# Patient Record
Sex: Female | Born: 1935 | Race: White | Hispanic: No | Marital: Married
Health system: Southern US, Community
[De-identification: ages and names within clinical notes are randomized; demographics above are authoritative.]

---

## 2004-12-19 ENCOUNTER — Ambulatory Visit: Payer: Self-pay | Admitting: Internal Medicine

## 2005-07-23 ENCOUNTER — Ambulatory Visit: Payer: Self-pay | Admitting: Ophthalmology

## 2005-07-30 ENCOUNTER — Ambulatory Visit: Payer: Self-pay | Admitting: Ophthalmology

## 2005-12-20 ENCOUNTER — Ambulatory Visit: Payer: Self-pay | Admitting: Internal Medicine

## 2006-12-24 ENCOUNTER — Ambulatory Visit: Payer: Self-pay | Admitting: Internal Medicine

## 2007-12-31 ENCOUNTER — Ambulatory Visit: Payer: Self-pay | Admitting: Internal Medicine

## 2009-01-04 ENCOUNTER — Ambulatory Visit: Payer: Self-pay | Admitting: Internal Medicine

## 2010-01-16 ENCOUNTER — Ambulatory Visit: Payer: Self-pay | Admitting: Internal Medicine

## 2010-12-04 ENCOUNTER — Inpatient Hospital Stay: Payer: Self-pay | Admitting: Family Medicine

## 2011-01-18 ENCOUNTER — Ambulatory Visit: Payer: Self-pay | Admitting: Internal Medicine

## 2011-11-12 ENCOUNTER — Observation Stay: Payer: Self-pay | Admitting: Internal Medicine

## 2011-11-12 LAB — COMPREHENSIVE METABOLIC PANEL
Anion Gap: 9 (ref 7–16)
BUN: 20 mg/dL — ABNORMAL HIGH (ref 7–18)
Bilirubin,Total: 0.4 mg/dL (ref 0.2–1.0)
Chloride: 94 mmol/L — ABNORMAL LOW (ref 98–107)
Creatinine: 1.15 mg/dL (ref 0.60–1.30)
EGFR (African American): 51 — ABNORMAL LOW
Osmolality: 269 (ref 275–301)
SGOT(AST): 23 U/L (ref 15–37)
SGPT (ALT): 20 U/L
Total Protein: 7.7 g/dL (ref 6.4–8.2)

## 2011-11-12 LAB — URINALYSIS, COMPLETE
Bacteria: NONE SEEN
Glucose,UR: NEGATIVE mg/dL (ref 0–75)
Nitrite: NEGATIVE
RBC,UR: 2 /HPF (ref 0–5)
WBC UR: 11 /HPF (ref 0–5)

## 2011-11-12 LAB — TROPONIN I
Troponin-I: <0.02 ng/mL
Troponin-I: <0.02 ng/mL

## 2011-11-12 LAB — CBC
HCT: 36.6 % (ref 35.0–47.0)
HGB: 12.3 g/dL (ref 12.0–16.0)
MCH: 29.5 pg (ref 26.0–34.0)
MCHC: 33.6 g/dL (ref 32.0–36.0)
Platelet: 257 10*3/uL (ref 150–440)
RBC: 4.17 10*6/uL (ref 3.80–5.20)
WBC: 11.3 10*3/uL — ABNORMAL HIGH (ref 3.6–11.0)

## 2011-11-12 LAB — CK TOTAL AND CKMB (NOT AT ARMC): CK, Total: 54 U/L (ref 21–215)

## 2011-11-13 LAB — BASIC METABOLIC PANEL
Anion Gap: 10 (ref 7–16)
Calcium, Total: 9.3 mg/dL (ref 8.5–10.1)
EGFR (African American): 48 — ABNORMAL LOW
EGFR (Non-African Amer.): 41 — ABNORMAL LOW
Glucose: 166 mg/dL — ABNORMAL HIGH (ref 65–99)
Sodium: 132 mmol/L — ABNORMAL LOW (ref 136–145)

## 2011-11-13 LAB — CK TOTAL AND CKMB (NOT AT ARMC): CK, Total: 110 U/L (ref 21–215)

## 2011-11-14 LAB — BASIC METABOLIC PANEL
Chloride: 93 mmol/L — ABNORMAL LOW (ref 98–107)
Creatinine: 0.92 mg/dL (ref 0.60–1.30)
EGFR (Non-African Amer.): 58 — ABNORMAL LOW
Glucose: 61 mg/dL — ABNORMAL LOW (ref 65–99)
Potassium: 3.9 mmol/L (ref 3.5–5.1)
Sodium: 130 mmol/L — ABNORMAL LOW (ref 136–145)

## 2011-11-14 LAB — TSH: Thyroid Stimulating Horm: 3.14 u[IU]/mL

## 2011-11-27 ENCOUNTER — Emergency Department: Payer: Self-pay | Admitting: Emergency Medicine

## 2011-11-27 LAB — CBC WITH DIFFERENTIAL/PLATELET
Basophil #: 0.1 10*3/uL (ref 0.0–0.1)
Eosinophil %: 1.6 %
Lymphocyte #: 1.8 10*3/uL (ref 1.0–3.6)
MCH: 30.1 pg (ref 26.0–34.0)
MCHC: 34.7 g/dL (ref 32.0–36.0)
MCV: 87 fL (ref 80–100)
Monocyte #: 0.6 x10 3/mm (ref 0.2–0.9)
Neutrophil #: 5.9 10*3/uL (ref 1.4–6.5)
Neutrophil %: 70.2 %
Platelet: 240 10*3/uL (ref 150–440)
RBC: 3.93 10*6/uL (ref 3.80–5.20)
RDW: 13.2 % (ref 11.5–14.5)
WBC: 8.4 10*3/uL (ref 3.6–11.0)

## 2011-11-27 LAB — COMPREHENSIVE METABOLIC PANEL
Alkaline Phosphatase: 55 U/L (ref 50–136)
Anion Gap: 11 (ref 7–16)
Bilirubin,Total: 0.3 mg/dL (ref 0.2–1.0)
Co2: 28 mmol/L (ref 21–32)
Creatinine: 0.85 mg/dL (ref 0.60–1.30)
EGFR (Non-African Amer.): >60
Glucose: 156 mg/dL — ABNORMAL HIGH (ref 65–99)
Osmolality: 274 (ref 275–301)
Potassium: 4.4 mmol/L (ref 3.5–5.1)
SGPT (ALT): 21 U/L
Sodium: 135 mmol/L — ABNORMAL LOW (ref 136–145)
Total Protein: 7.1 g/dL (ref 6.4–8.2)

## 2011-11-27 LAB — URINALYSIS, COMPLETE
Ketone: NEGATIVE
Ph: 6 (ref 4.5–8.0)
Protein: 30
Specific Gravity: 1.009 (ref 1.003–1.030)
Squamous Epithelial: <1
WBC UR: <1 /HPF (ref 0–5)

## 2011-11-27 LAB — CK TOTAL AND CKMB (NOT AT ARMC): CK, Total: 60 U/L (ref 21–215)

## 2012-01-21 ENCOUNTER — Ambulatory Visit: Payer: Self-pay | Admitting: Internal Medicine

## 2013-01-21 ENCOUNTER — Ambulatory Visit: Payer: Self-pay | Admitting: Internal Medicine

## 2013-09-28 ENCOUNTER — Ambulatory Visit: Payer: Self-pay | Admitting: Hospice and Palliative Medicine

## 2013-09-28 ENCOUNTER — Ambulatory Visit
Admit: 2013-09-28 | Disposition: A | Discharge status: Home / Self Care | Payer: Self-pay | Attending: Nurse Practitioner | Admitting: Nurse Practitioner

## 2013-10-07 ENCOUNTER — Inpatient Hospital Stay: Payer: Self-pay | Admitting: Internal Medicine

## 2013-10-07 LAB — COMPREHENSIVE METABOLIC PANEL
ALBUMIN: 3.2 g/dL — AB (ref 3.4–5.0)
ALK PHOS: 59 U/L
AST: 18 U/L (ref 15–37)
Anion Gap: 11 (ref 7–16)
BUN: 24 mg/dL — ABNORMAL HIGH (ref 7–18)
Bilirubin,Total: 0.7 mg/dL (ref 0.2–1.0)
Calcium, Total: 9 mg/dL (ref 8.5–10.1)
Chloride: 91 mmol/L — ABNORMAL LOW (ref 98–107)
Co2: 26 mmol/L (ref 21–32)
Creatinine: 1.54 mg/dL — ABNORMAL HIGH (ref 0.60–1.30)
EGFR (African American): 35 — ABNORMAL LOW
GFR CALC NON AF AMER: 30 — AB
Glucose: 326 mg/dL — ABNORMAL HIGH (ref 65–99)
Osmolality: 274 (ref 275–301)
Potassium: 4.1 mmol/L (ref 3.5–5.1)
SGPT (ALT): 16 U/L (ref 12–78)
Sodium: 128 mmol/L — ABNORMAL LOW (ref 136–145)
TOTAL PROTEIN: 7.5 g/dL (ref 6.4–8.2)

## 2013-10-07 LAB — APTT: ACTIVATED PTT: 33.3 s (ref 23.6–35.9)

## 2013-10-07 LAB — CK TOTAL AND CKMB (NOT AT ARMC)
CK, TOTAL: 66 U/L
CK-MB: 1.1 ng/mL (ref 0.5–3.6)

## 2013-10-07 LAB — CBC
HCT: 33.7 % — AB (ref 35.0–47.0)
HGB: 11 g/dL — ABNORMAL LOW (ref 12.0–16.0)
MCH: 29.5 pg (ref 26.0–34.0)
MCHC: 32.6 g/dL (ref 32.0–36.0)
MCV: 91 fL (ref 80–100)
Platelet: 285 10*3/uL (ref 150–440)
RBC: 3.72 10*6/uL — ABNORMAL LOW (ref 3.80–5.20)
RDW: 13.1 % (ref 11.5–14.5)
WBC: 19.4 10*3/uL — ABNORMAL HIGH (ref 3.6–11.0)

## 2013-10-07 LAB — PROTIME-INR
INR: 1.1
Prothrombin Time: 13.9 secs (ref 11.5–14.7)

## 2013-10-07 LAB — PRO B NATRIURETIC PEPTIDE: B-Type Natriuretic Peptide: 3813 pg/mL — ABNORMAL HIGH (ref 0–450)

## 2013-10-07 LAB — D-DIMER(ARMC): D-DIMER: 1597 ng/mL

## 2013-10-07 LAB — TROPONIN I: Troponin-I: 0.04 ng/mL

## 2013-10-08 LAB — CBC WITH DIFFERENTIAL/PLATELET
BASOS ABS: 0 10*3/uL (ref 0.0–0.1)
BASOS PCT: 0.1 %
Eosinophil #: 0 10*3/uL (ref 0.0–0.7)
Eosinophil %: 0 %
HCT: 29.7 % — ABNORMAL LOW (ref 35.0–47.0)
HGB: 9.8 g/dL — ABNORMAL LOW (ref 12.0–16.0)
Lymphocyte #: 0.5 10*3/uL — ABNORMAL LOW (ref 1.0–3.6)
Lymphocyte %: 2.5 %
MCH: 29.5 pg (ref 26.0–34.0)
MCHC: 32.8 g/dL (ref 32.0–36.0)
MCV: 90 fL (ref 80–100)
MONO ABS: 0.8 x10 3/mm (ref 0.2–0.9)
Monocyte %: 3.9 %
NEUTROS ABS: 18.9 10*3/uL — AB (ref 1.4–6.5)
Neutrophil %: 93.5 %
PLATELETS: 231 10*3/uL (ref 150–440)
RBC: 3.31 10*6/uL — ABNORMAL LOW (ref 3.80–5.20)
RDW: 13.3 % (ref 11.5–14.5)
WBC: 20.3 10*3/uL — ABNORMAL HIGH (ref 3.6–11.0)

## 2013-10-08 LAB — BASIC METABOLIC PANEL
Anion Gap: 9 (ref 7–16)
BUN: 40 mg/dL — ABNORMAL HIGH (ref 7–18)
CALCIUM: 9 mg/dL (ref 8.5–10.1)
Chloride: 95 mmol/L — ABNORMAL LOW (ref 98–107)
Co2: 25 mmol/L (ref 21–32)
Creatinine: 1.5 mg/dL — ABNORMAL HIGH (ref 0.60–1.30)
EGFR (African American): 36 — ABNORMAL LOW
GFR CALC NON AF AMER: 31 — AB
GLUCOSE: 202 mg/dL — AB (ref 65–99)
Osmolality: 274 (ref 275–301)
Potassium: 4.4 mmol/L (ref 3.5–5.1)
SODIUM: 129 mmol/L — AB (ref 136–145)

## 2013-10-09 LAB — CBC WITH DIFFERENTIAL/PLATELET
BASOS PCT: 0.1 %
Basophil #: 0 10*3/uL (ref 0.0–0.1)
EOS ABS: 0 10*3/uL (ref 0.0–0.7)
EOS PCT: 0.1 %
HCT: 28.8 % — AB (ref 35.0–47.0)
HGB: 9.7 g/dL — AB (ref 12.0–16.0)
LYMPHS ABS: 0.6 10*3/uL — AB (ref 1.0–3.6)
LYMPHS PCT: 3.4 %
MCH: 30.2 pg (ref 26.0–34.0)
MCHC: 33.7 g/dL (ref 32.0–36.0)
MCV: 90 fL (ref 80–100)
Monocyte #: 1.2 x10 3/mm — ABNORMAL HIGH (ref 0.2–0.9)
Monocyte %: 6.3 %
Neutrophil #: 17.3 10*3/uL — ABNORMAL HIGH (ref 1.4–6.5)
Neutrophil %: 90.1 %
PLATELETS: 249 10*3/uL (ref 150–440)
RBC: 3.21 10*6/uL — ABNORMAL LOW (ref 3.80–5.20)
RDW: 13.4 % (ref 11.5–14.5)
WBC: 19.2 10*3/uL — AB (ref 3.6–11.0)

## 2013-10-09 LAB — CK-MB
CK-MB: 11.5 ng/mL — ABNORMAL HIGH (ref 0.5–3.6)
CK-MB: 10.6 ng/mL — ABNORMAL HIGH (ref 0.5–3.6)
CK-MB: 18.4 ng/mL — AB (ref 0.5–3.6)

## 2013-10-09 LAB — BASIC METABOLIC PANEL
Anion Gap: 6 — ABNORMAL LOW (ref 7–16)
BUN: 41 mg/dL — ABNORMAL HIGH (ref 7–18)
CALCIUM: 8.6 mg/dL (ref 8.5–10.1)
CHLORIDE: 101 mmol/L (ref 98–107)
Co2: 24 mmol/L (ref 21–32)
Creatinine: 1.25 mg/dL (ref 0.60–1.30)
EGFR (African American): 45 — ABNORMAL LOW
GFR CALC NON AF AMER: 39 — AB
Glucose: 89 mg/dL (ref 65–99)
Osmolality: 272 (ref 275–301)
POTASSIUM: 4.2 mmol/L (ref 3.5–5.1)
Sodium: 131 mmol/L — ABNORMAL LOW (ref 136–145)

## 2013-10-09 LAB — TROPONIN I
TROPONIN-I: 0.63 ng/mL — AB
TROPONIN-I: 0.9 ng/mL — AB

## 2013-10-10 LAB — CBC WITH DIFFERENTIAL/PLATELET
Basophil #: 0 10*3/uL (ref 0.0–0.1)
Basophil %: 0.1 %
EOS PCT: 0 %
Eosinophil #: 0 10*3/uL (ref 0.0–0.7)
HCT: 28.2 % — ABNORMAL LOW (ref 35.0–47.0)
HGB: 9.5 g/dL — ABNORMAL LOW (ref 12.0–16.0)
Lymphocyte #: 0.5 10*3/uL — ABNORMAL LOW (ref 1.0–3.6)
Lymphocyte %: 4.2 %
MCH: 29.5 pg (ref 26.0–34.0)
MCHC: 33.7 g/dL (ref 32.0–36.0)
MCV: 88 fL (ref 80–100)
Monocyte #: 0.7 x10 3/mm (ref 0.2–0.9)
Monocyte %: 5.7 %
NEUTROS ABS: 10.4 10*3/uL — AB (ref 1.4–6.5)
Neutrophil %: 90 %
Platelet: 230 10*3/uL (ref 150–440)
RBC: 3.21 10*6/uL — AB (ref 3.80–5.20)
RDW: 13.2 % (ref 11.5–14.5)
WBC: 11.6 10*3/uL — ABNORMAL HIGH (ref 3.6–11.0)

## 2013-10-10 LAB — BASIC METABOLIC PANEL
Anion Gap: 8 (ref 7–16)
BUN: 43 mg/dL — ABNORMAL HIGH (ref 7–18)
CALCIUM: 9.5 mg/dL (ref 8.5–10.1)
CREATININE: 1.28 mg/dL (ref 0.60–1.30)
Chloride: 99 mmol/L (ref 98–107)
Co2: 26 mmol/L (ref 21–32)
EGFR (African American): 44 — ABNORMAL LOW
GFR CALC NON AF AMER: 38 — AB
GLUCOSE: 102 mg/dL — AB (ref 65–99)
Osmolality: 277 (ref 275–301)
Potassium: 3 mmol/L — ABNORMAL LOW (ref 3.5–5.1)
SODIUM: 133 mmol/L — AB (ref 136–145)

## 2013-10-11 LAB — CBC WITH DIFFERENTIAL/PLATELET
Basophil #: 0 10*3/uL (ref 0.0–0.1)
Basophil %: 0.1 %
Eosinophil #: 0.1 10*3/uL (ref 0.0–0.7)
Eosinophil %: 0.3 %
HCT: 28.1 % — AB (ref 35.0–47.0)
HGB: 9.2 g/dL — ABNORMAL LOW (ref 12.0–16.0)
Lymphocyte #: 0.9 10*3/uL — ABNORMAL LOW (ref 1.0–3.6)
Lymphocyte %: 5.2 %
MCH: 28.9 pg (ref 26.0–34.0)
MCHC: 32.8 g/dL (ref 32.0–36.0)
MCV: 88 fL (ref 80–100)
Monocyte #: 1.1 x10 3/mm — ABNORMAL HIGH (ref 0.2–0.9)
Monocyte %: 6.5 %
Neutrophil #: 14.5 10*3/uL — ABNORMAL HIGH (ref 1.4–6.5)
Neutrophil %: 87.9 %
PLATELETS: 315 10*3/uL (ref 150–440)
RBC: 3.2 10*6/uL — ABNORMAL LOW (ref 3.80–5.20)
RDW: 13.6 % (ref 11.5–14.5)
WBC: 16.5 10*3/uL — AB (ref 3.6–11.0)

## 2013-10-11 LAB — BASIC METABOLIC PANEL
ANION GAP: 9 (ref 7–16)
BUN: 48 mg/dL — ABNORMAL HIGH (ref 7–18)
CALCIUM: 9 mg/dL (ref 8.5–10.1)
CO2: 26 mmol/L (ref 21–32)
Chloride: 98 mmol/L (ref 98–107)
Creatinine: 1.6 mg/dL — ABNORMAL HIGH (ref 0.60–1.30)
EGFR (African American): 34 — ABNORMAL LOW
GFR CALC NON AF AMER: 29 — AB
Glucose: 132 mg/dL — ABNORMAL HIGH (ref 65–99)
Osmolality: 281 (ref 275–301)
Potassium: 3.6 mmol/L (ref 3.5–5.1)
SODIUM: 133 mmol/L — AB (ref 136–145)

## 2013-10-12 LAB — CULTURE, BLOOD (SINGLE)

## 2013-10-12 LAB — BASIC METABOLIC PANEL
ANION GAP: 10 (ref 7–16)
BUN: 57 mg/dL — AB (ref 7–18)
CHLORIDE: 97 mmol/L — AB (ref 98–107)
CREATININE: 1.64 mg/dL — AB (ref 0.60–1.30)
Calcium, Total: 9.2 mg/dL (ref 8.5–10.1)
Co2: 27 mmol/L (ref 21–32)
EGFR (Non-African Amer.): 28 — ABNORMAL LOW
GFR CALC AF AMER: 33 — AB
GLUCOSE: 244 mg/dL — AB (ref 65–99)
Osmolality: 292 (ref 275–301)
Potassium: 3.5 mmol/L (ref 3.5–5.1)
SODIUM: 134 mmol/L — AB (ref 136–145)

## 2013-10-13 LAB — CBC WITH DIFFERENTIAL/PLATELET
Basophil #: 0 10*3/uL (ref 0.0–0.1)
Basophil %: 0.1 %
Eosinophil #: 0 10*3/uL (ref 0.0–0.7)
Eosinophil %: 0.1 %
HCT: 31 % — AB (ref 35.0–47.0)
HGB: 10.2 g/dL — ABNORMAL LOW (ref 12.0–16.0)
LYMPHS ABS: 0.5 10*3/uL — AB (ref 1.0–3.6)
LYMPHS PCT: 3 %
MCH: 29.6 pg (ref 26.0–34.0)
MCHC: 33.1 g/dL (ref 32.0–36.0)
MCV: 90 fL (ref 80–100)
Monocyte #: 0.9 x10 3/mm (ref 0.2–0.9)
Monocyte %: 5.3 %
NEUTROS ABS: 15.9 10*3/uL — AB (ref 1.4–6.5)
Neutrophil %: 91.5 %
Platelet: 331 10*3/uL (ref 150–440)
RBC: 3.45 10*6/uL — AB (ref 3.80–5.20)
RDW: 14 % (ref 11.5–14.5)
WBC: 17.4 10*3/uL — AB (ref 3.6–11.0)

## 2013-10-13 LAB — BASIC METABOLIC PANEL
Anion Gap: 8 (ref 7–16)
BUN: 67 mg/dL — AB (ref 7–18)
CALCIUM: 9.7 mg/dL (ref 8.5–10.1)
Chloride: 96 mmol/L — ABNORMAL LOW (ref 98–107)
Co2: 32 mmol/L (ref 21–32)
Creatinine: 1.37 mg/dL — ABNORMAL HIGH (ref 0.60–1.30)
EGFR (African American): 41 — ABNORMAL LOW
EGFR (Non-African Amer.): 35 — ABNORMAL LOW
GLUCOSE: 278 mg/dL — AB (ref 65–99)
OSMOLALITY: 301 (ref 275–301)
POTASSIUM: 3.5 mmol/L (ref 3.5–5.1)
Sodium: 136 mmol/L (ref 136–145)

## 2013-10-13 LAB — EXPECTORATED SPUTUM ASSESSMENT W REFEX TO RESP CULTURE

## 2013-10-14 LAB — CBC WITH DIFFERENTIAL/PLATELET
Basophil #: 0 10*3/uL (ref 0.0–0.1)
Basophil %: 0.3 %
EOS ABS: 0 10*3/uL (ref 0.0–0.7)
Eosinophil %: 0 %
HCT: 30.8 % — ABNORMAL LOW (ref 35.0–47.0)
HGB: 10 g/dL — AB (ref 12.0–16.0)
LYMPHS ABS: 0.6 10*3/uL — AB (ref 1.0–3.6)
Lymphocyte %: 3.6 %
MCH: 29.4 pg (ref 26.0–34.0)
MCHC: 32.6 g/dL (ref 32.0–36.0)
MCV: 90 fL (ref 80–100)
Monocyte #: 1.3 x10 3/mm — ABNORMAL HIGH (ref 0.2–0.9)
Monocyte %: 7.4 %
Neutrophil #: 15.6 10*3/uL — ABNORMAL HIGH (ref 1.4–6.5)
Neutrophil %: 88.7 %
PLATELETS: 324 10*3/uL (ref 150–440)
RBC: 3.41 10*6/uL — ABNORMAL LOW (ref 3.80–5.20)
RDW: 13.9 % (ref 11.5–14.5)
WBC: 17.6 10*3/uL — ABNORMAL HIGH (ref 3.6–11.0)

## 2013-10-14 LAB — BASIC METABOLIC PANEL
Anion Gap: 6 — ABNORMAL LOW (ref 7–16)
BUN: 68 mg/dL — ABNORMAL HIGH (ref 7–18)
CALCIUM: 9.4 mg/dL (ref 8.5–10.1)
CO2: 33 mmol/L — AB (ref 21–32)
CREATININE: 1.27 mg/dL (ref 0.60–1.30)
Chloride: 98 mmol/L (ref 98–107)
EGFR (Non-African Amer.): 38 — ABNORMAL LOW
GFR CALC AF AMER: 45 — AB
GLUCOSE: 231 mg/dL — AB (ref 65–99)
OSMOLALITY: 301 (ref 275–301)
Potassium: 4.1 mmol/L (ref 3.5–5.1)
Sodium: 137 mmol/L (ref 136–145)

## 2013-10-14 LAB — EXPECTORATED SPUTUM ASSESSMENT W GRAM STAIN, RFLX TO RESP C

## 2013-10-14 LAB — PHOSPHORUS: Phosphorus: 3.3 mg/dL (ref 2.5–4.9)

## 2013-10-14 LAB — MAGNESIUM: Magnesium: 2 mg/dL

## 2013-10-15 LAB — CBC WITH DIFFERENTIAL/PLATELET
BASOS ABS: 0 10*3/uL (ref 0.0–0.1)
BASOS PCT: 0.2 %
EOS PCT: 0 %
Eosinophil #: 0 10*3/uL (ref 0.0–0.7)
HCT: 33.2 % — ABNORMAL LOW (ref 35.0–47.0)
HGB: 10.9 g/dL — AB (ref 12.0–16.0)
Lymphocyte #: 0.9 10*3/uL — ABNORMAL LOW (ref 1.0–3.6)
Lymphocyte %: 3.7 %
MCH: 29.8 pg (ref 26.0–34.0)
MCHC: 32.8 g/dL (ref 32.0–36.0)
MCV: 91 fL (ref 80–100)
Monocyte #: 1.6 x10 3/mm — ABNORMAL HIGH (ref 0.2–0.9)
Monocyte %: 6.8 %
NEUTROS ABS: 20.7 10*3/uL — AB (ref 1.4–6.5)
NEUTROS PCT: 89.3 %
PLATELETS: 374 10*3/uL (ref 150–440)
RBC: 3.65 10*6/uL — AB (ref 3.80–5.20)
RDW: 14 % (ref 11.5–14.5)
WBC: 23.2 10*3/uL — ABNORMAL HIGH (ref 3.6–11.0)

## 2013-10-15 LAB — BASIC METABOLIC PANEL
Anion Gap: 3 — ABNORMAL LOW (ref 7–16)
BUN: 58 mg/dL — ABNORMAL HIGH (ref 7–18)
CALCIUM: 10.1 mg/dL (ref 8.5–10.1)
CREATININE: 1.06 mg/dL (ref 0.60–1.30)
Chloride: 101 mmol/L (ref 98–107)
Co2: 34 mmol/L — ABNORMAL HIGH (ref 21–32)
EGFR (African American): 55 — ABNORMAL LOW
EGFR (Non-African Amer.): 48 — ABNORMAL LOW
Glucose: 197 mg/dL — ABNORMAL HIGH (ref 65–99)
Osmolality: 297 (ref 275–301)
POTASSIUM: 5.3 mmol/L — AB (ref 3.5–5.1)
Sodium: 138 mmol/L (ref 136–145)

## 2013-10-15 LAB — URINE CULTURE

## 2013-10-16 LAB — CBC WITH DIFFERENTIAL/PLATELET
BASOS ABS: 0 10*3/uL (ref 0.0–0.1)
BASOS PCT: 0.2 %
Eosinophil #: 0 10*3/uL (ref 0.0–0.7)
Eosinophil %: 0 %
HCT: 33.1 % — ABNORMAL LOW (ref 35.0–47.0)
HGB: 10.7 g/dL — ABNORMAL LOW (ref 12.0–16.0)
LYMPHS ABS: 0.9 10*3/uL — AB (ref 1.0–3.6)
LYMPHS PCT: 4.8 %
MCH: 29.5 pg (ref 26.0–34.0)
MCHC: 32.2 g/dL (ref 32.0–36.0)
MCV: 92 fL (ref 80–100)
MONO ABS: 1 x10 3/mm — AB (ref 0.2–0.9)
MONOS PCT: 5.1 %
NEUTROS ABS: 17.3 10*3/uL — AB (ref 1.4–6.5)
Neutrophil %: 89.9 %
Platelet: 307 10*3/uL (ref 150–440)
RBC: 3.61 10*6/uL — ABNORMAL LOW (ref 3.80–5.20)
RDW: 14 % (ref 11.5–14.5)
WBC: 19.2 10*3/uL — ABNORMAL HIGH (ref 3.6–11.0)

## 2013-10-16 LAB — BASIC METABOLIC PANEL
ANION GAP: 4 — AB (ref 7–16)
BUN: 50 mg/dL — ABNORMAL HIGH (ref 7–18)
CHLORIDE: 102 mmol/L (ref 98–107)
CO2: 31 mmol/L (ref 21–32)
Calcium, Total: 9.6 mg/dL (ref 8.5–10.1)
Creatinine: 0.91 mg/dL (ref 0.60–1.30)
EGFR (African American): >60
EGFR (Non-African Amer.): 58 — ABNORMAL LOW
Glucose: 141 mg/dL — ABNORMAL HIGH (ref 65–99)
Osmolality: 290 (ref 275–301)
POTASSIUM: 4.2 mmol/L (ref 3.5–5.1)
Sodium: 137 mmol/L (ref 136–145)

## 2013-10-18 LAB — BASIC METABOLIC PANEL
Anion Gap: 7 (ref 7–16)
BUN: 45 mg/dL — ABNORMAL HIGH (ref 7–18)
Calcium, Total: 9.3 mg/dL (ref 8.5–10.1)
Chloride: 99 mmol/L (ref 98–107)
Co2: 29 mmol/L (ref 21–32)
Creatinine: 0.96 mg/dL (ref 0.60–1.30)
EGFR (Non-African Amer.): 54 — ABNORMAL LOW
Glucose: 232 mg/dL — ABNORMAL HIGH (ref 65–99)
Osmolality: 289 (ref 275–301)
Potassium: 4.5 mmol/L (ref 3.5–5.1)
Sodium: 135 mmol/L — ABNORMAL LOW (ref 136–145)

## 2013-10-18 LAB — CBC WITH DIFFERENTIAL/PLATELET
BASOS PCT: 0.1 %
Basophil #: 0 10*3/uL (ref 0.0–0.1)
EOS ABS: 0 10*3/uL (ref 0.0–0.7)
Eosinophil %: 0 %
HCT: 36.1 % (ref 35.0–47.0)
HGB: 11.5 g/dL — ABNORMAL LOW (ref 12.0–16.0)
LYMPHS ABS: 0.5 10*3/uL — AB (ref 1.0–3.6)
LYMPHS PCT: 2 %
MCH: 28.9 pg (ref 26.0–34.0)
MCHC: 31.8 g/dL — ABNORMAL LOW (ref 32.0–36.0)
MCV: 91 fL (ref 80–100)
MONOS PCT: 5.1 %
Monocyte #: 1.2 x10 3/mm — ABNORMAL HIGH (ref 0.2–0.9)
NEUTROS ABS: 21.8 10*3/uL — AB (ref 1.4–6.5)
Neutrophil %: 92.8 %
PLATELETS: 295 10*3/uL (ref 150–440)
RBC: 3.96 10*6/uL (ref 3.80–5.20)
RDW: 14.1 % (ref 11.5–14.5)
WBC: 23.5 10*3/uL — ABNORMAL HIGH (ref 3.6–11.0)

## 2013-10-18 LAB — CULTURE, BLOOD (SINGLE)

## 2013-10-19 LAB — CBC WITH DIFFERENTIAL/PLATELET
BASOS PCT: 0.1 %
Basophil #: 0 10*3/uL (ref 0.0–0.1)
EOS ABS: 0 10*3/uL (ref 0.0–0.7)
EOS PCT: 0 %
HCT: 34.2 % — AB (ref 35.0–47.0)
HGB: 10.8 g/dL — ABNORMAL LOW (ref 12.0–16.0)
LYMPHS ABS: 0.4 10*3/uL — AB (ref 1.0–3.6)
LYMPHS PCT: 2.5 %
MCH: 28.8 pg (ref 26.0–34.0)
MCHC: 31.6 g/dL — ABNORMAL LOW (ref 32.0–36.0)
MCV: 91 fL (ref 80–100)
MONO ABS: 0.8 x10 3/mm (ref 0.2–0.9)
MONOS PCT: 4.7 %
NEUTROS ABS: 16.5 10*3/uL — AB (ref 1.4–6.5)
Neutrophil %: 92.7 %
Platelet: 254 10*3/uL (ref 150–440)
RBC: 3.75 10*6/uL — AB (ref 3.80–5.20)
RDW: 14.1 % (ref 11.5–14.5)
WBC: 17.8 10*3/uL — AB (ref 3.6–11.0)

## 2013-10-19 LAB — BASIC METABOLIC PANEL
ANION GAP: 5 — AB (ref 7–16)
BUN: 47 mg/dL — ABNORMAL HIGH (ref 7–18)
CALCIUM: 8.6 mg/dL (ref 8.5–10.1)
CHLORIDE: 100 mmol/L (ref 98–107)
CO2: 32 mmol/L (ref 21–32)
Creatinine: 0.88 mg/dL (ref 0.60–1.30)
EGFR (African American): >60
GFR CALC NON AF AMER: 60 — AB
Glucose: 115 mg/dL — ABNORMAL HIGH (ref 65–99)
Osmolality: 287 (ref 275–301)
Potassium: 4.6 mmol/L (ref 3.5–5.1)
Sodium: 137 mmol/L (ref 136–145)

## 2013-10-20 LAB — CBC WITH DIFFERENTIAL/PLATELET
BASOS ABS: 0 10*3/uL (ref 0.0–0.1)
Basophil %: 0.1 %
EOS PCT: 0.1 %
Eosinophil #: 0 10*3/uL (ref 0.0–0.7)
HCT: 35.7 % (ref 35.0–47.0)
HGB: 11.2 g/dL — AB (ref 12.0–16.0)
LYMPHS PCT: 4.3 %
Lymphocyte #: 0.8 10*3/uL — ABNORMAL LOW (ref 1.0–3.6)
MCH: 28.4 pg (ref 26.0–34.0)
MCHC: 31.3 g/dL — ABNORMAL LOW (ref 32.0–36.0)
MCV: 91 fL (ref 80–100)
MONO ABS: 1 x10 3/mm — AB (ref 0.2–0.9)
MONOS PCT: 5.8 %
NEUTROS PCT: 89.7 %
Neutrophil #: 16.2 10*3/uL — ABNORMAL HIGH (ref 1.4–6.5)
Platelet: 252 10*3/uL (ref 150–440)
RBC: 3.93 10*6/uL (ref 3.80–5.20)
RDW: 14.2 % (ref 11.5–14.5)
WBC: 18 10*3/uL — ABNORMAL HIGH (ref 3.6–11.0)

## 2013-10-20 LAB — BASIC METABOLIC PANEL
Anion Gap: 5 — ABNORMAL LOW (ref 7–16)
BUN: 42 mg/dL — ABNORMAL HIGH (ref 7–18)
Calcium, Total: 9 mg/dL (ref 8.5–10.1)
Chloride: 98 mmol/L (ref 98–107)
Co2: 32 mmol/L (ref 21–32)
Creatinine: 0.7 mg/dL (ref 0.60–1.30)
EGFR (African American): >60
EGFR (Non-African Amer.): >60
GLUCOSE: 84 mg/dL (ref 65–99)
Osmolality: 280 (ref 275–301)
Potassium: 4.2 mmol/L (ref 3.5–5.1)
Sodium: 135 mmol/L — ABNORMAL LOW (ref 136–145)

## 2013-10-21 ENCOUNTER — Encounter: Payer: Self-pay | Admitting: Internal Medicine

## 2013-10-21 LAB — BASIC METABOLIC PANEL
ANION GAP: 6 — AB (ref 7–16)
BUN: 40 mg/dL — ABNORMAL HIGH (ref 7–18)
CHLORIDE: 96 mmol/L — AB (ref 98–107)
Calcium, Total: 9.3 mg/dL (ref 8.5–10.1)
Co2: 32 mmol/L (ref 21–32)
Creatinine: 0.68 mg/dL (ref 0.60–1.30)
EGFR (African American): >60
Glucose: 50 mg/dL — ABNORMAL LOW (ref 65–99)
Osmolality: 275 (ref 275–301)
Potassium: 4.2 mmol/L (ref 3.5–5.1)
Sodium: 134 mmol/L — ABNORMAL LOW (ref 136–145)

## 2013-10-23 LAB — FERRITIN: FERRITIN (ARMC): 301 ng/mL (ref 8–388)

## 2013-10-23 LAB — CBC WITH DIFFERENTIAL/PLATELET
Basophil #: 0 10*3/uL (ref 0.0–0.1)
Basophil %: 0.1 %
EOS ABS: 0.1 10*3/uL (ref 0.0–0.7)
Eosinophil %: 0.8 %
HCT: 35.9 % (ref 35.0–47.0)
HGB: 11.4 g/dL — AB (ref 12.0–16.0)
Lymphocyte #: 1 10*3/uL (ref 1.0–3.6)
Lymphocyte %: 8.3 %
MCH: 28.8 pg (ref 26.0–34.0)
MCHC: 31.8 g/dL — ABNORMAL LOW (ref 32.0–36.0)
MCV: 91 fL (ref 80–100)
MONOS PCT: 6.4 %
Monocyte #: 0.7 x10 3/mm (ref 0.2–0.9)
NEUTROS PCT: 84.4 %
Neutrophil #: 9.9 10*3/uL — ABNORMAL HIGH (ref 1.4–6.5)
Platelet: 249 10*3/uL (ref 150–440)
RBC: 3.96 10*6/uL (ref 3.80–5.20)
RDW: 14 % (ref 11.5–14.5)
WBC: 11.7 10*3/uL — AB (ref 3.6–11.0)

## 2013-10-23 LAB — IRON AND TIBC
Iron Bind.Cap.(Total): 239 ug/dL — ABNORMAL LOW (ref 250–450)
Iron Saturation: 21 %
Iron: 50 ug/dL (ref 50–170)
Unbound Iron-Bind.Cap.: 189 ug/dL

## 2013-10-23 LAB — BASIC METABOLIC PANEL
Anion Gap: 9 (ref 7–16)
BUN: 44 mg/dL — AB (ref 7–18)
CREATININE: 1.28 mg/dL (ref 0.60–1.30)
Calcium, Total: 9.1 mg/dL (ref 8.5–10.1)
Chloride: 92 mmol/L — ABNORMAL LOW (ref 98–107)
Co2: 29 mmol/L (ref 21–32)
EGFR (African American): 44 — ABNORMAL LOW
EGFR (Non-African Amer.): 38 — ABNORMAL LOW
Glucose: 201 mg/dL — ABNORMAL HIGH (ref 65–99)
Osmolality: 278 (ref 275–301)
POTASSIUM: 4.2 mmol/L (ref 3.5–5.1)
Sodium: 130 mmol/L — ABNORMAL LOW (ref 136–145)

## 2013-10-27 LAB — HEMOGLOBIN A1C: HEMOGLOBIN A1C: 6.7 % — AB (ref 4.2–6.3)

## 2013-10-28 ENCOUNTER — Ambulatory Visit
Admit: 2013-10-28 | Disposition: A | Discharge status: Home / Self Care | Payer: Self-pay | Attending: Nurse Practitioner | Admitting: Nurse Practitioner

## 2013-10-28 ENCOUNTER — Ambulatory Visit: Payer: Self-pay | Admitting: Hospice and Palliative Medicine

## 2013-10-28 ENCOUNTER — Encounter: Payer: Self-pay | Admitting: Internal Medicine

## 2013-11-03 ENCOUNTER — Ambulatory Visit: Payer: Self-pay | Admitting: Gerontology

## 2013-11-03 LAB — CBC WITH DIFFERENTIAL/PLATELET
BASOS PCT: 1 %
Basophil #: 0.1 10*3/uL (ref 0.0–0.1)
EOS ABS: 0.7 10*3/uL (ref 0.0–0.7)
EOS PCT: 11.8 %
HCT: 35.1 % (ref 35.0–47.0)
HGB: 11.4 g/dL — ABNORMAL LOW (ref 12.0–16.0)
LYMPHS PCT: 18.9 %
Lymphocyte #: 1.1 10*3/uL (ref 1.0–3.6)
MCH: 29.1 pg (ref 26.0–34.0)
MCHC: 32.4 g/dL (ref 32.0–36.0)
MCV: 90 fL (ref 80–100)
Monocyte #: 0.9 x10 3/mm (ref 0.2–0.9)
Monocyte %: 15.5 %
NEUTROS ABS: 2.9 10*3/uL (ref 1.4–6.5)
Neutrophil %: 52.8 %
Platelet: 142 10*3/uL — ABNORMAL LOW (ref 150–440)
RBC: 3.91 10*6/uL (ref 3.80–5.20)
RDW: 14.5 % (ref 11.5–14.5)
WBC: 5.5 10*3/uL (ref 3.6–11.0)

## 2013-11-03 LAB — BASIC METABOLIC PANEL
Anion Gap: 11 (ref 7–16)
BUN: 24 mg/dL — ABNORMAL HIGH (ref 7–18)
Calcium, Total: 8.9 mg/dL (ref 8.5–10.1)
Chloride: 96 mmol/L — ABNORMAL LOW (ref 98–107)
Co2: 27 mmol/L (ref 21–32)
Creatinine: 1.22 mg/dL (ref 0.60–1.30)
EGFR (African American): 47 — ABNORMAL LOW
GFR CALC NON AF AMER: 40 — AB
GLUCOSE: 130 mg/dL — AB (ref 65–99)
Osmolality: 274 (ref 275–301)
Potassium: 4 mmol/L (ref 3.5–5.1)
Sodium: 134 mmol/L — ABNORMAL LOW (ref 136–145)

## 2013-11-28 ENCOUNTER — Encounter: Payer: Self-pay | Admitting: Internal Medicine

## 2013-12-19 IMAGING — CT CT HEAD WITHOUT CONTRAST
3 of 4 series · 17 of 30 positions shown, 19 images · non-contrast
Comparison: none

REASON FOR EXAM: CVA
COMMENTS:   May transport without cardiac monitor

PROCEDURE:     CT  - CT HEAD WITHOUT CONTRAST  - November 12, 2011  [DATE]
RESULT:     History: CVA
Comparison Study: Prior CT of 12/04/2010.

[Series 2: without · axial · non-contrast · 0.39mm/px · z∈[+938,+1033]mm · 5 of 29 slices shown]
[im 5/29  brain]
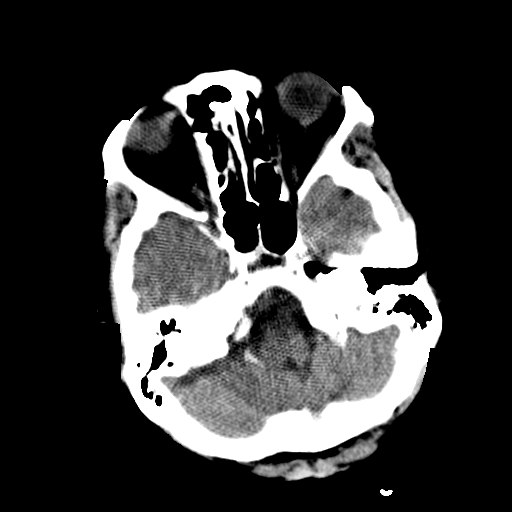
[im 10/29  brain]
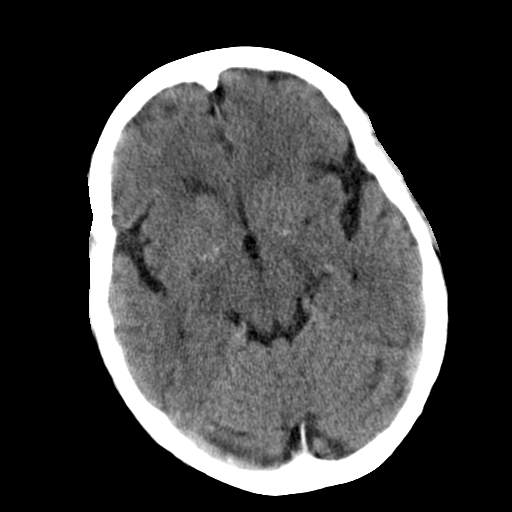
[im 15/29  brain]
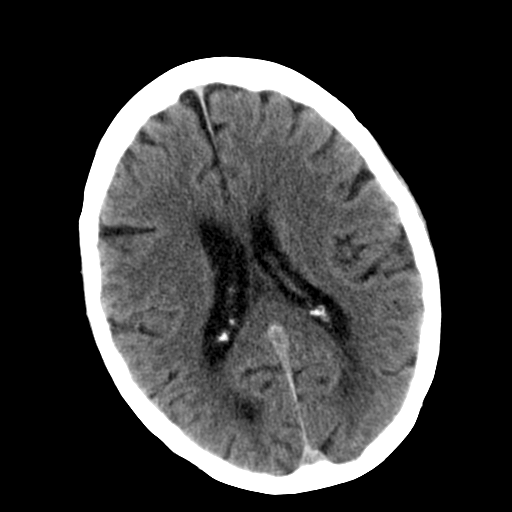
[im 19/29  brain]
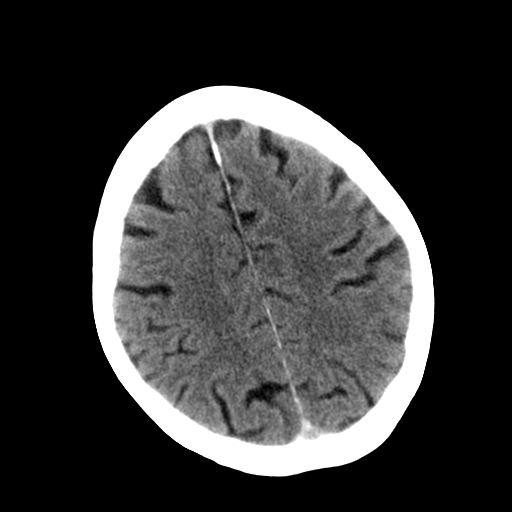
[im 24/29  brain]
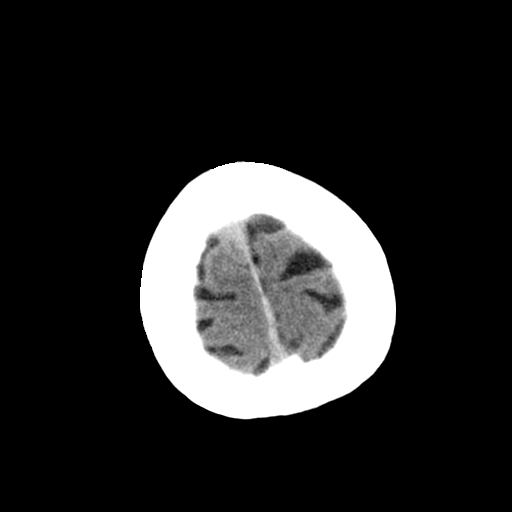

[Series 4: without 2 · axial · non-contrast · 0.39mm/px · z∈[+923,+1027]mm · 6 of 31 slices shown, 8 images]
[im 5/31  brain]
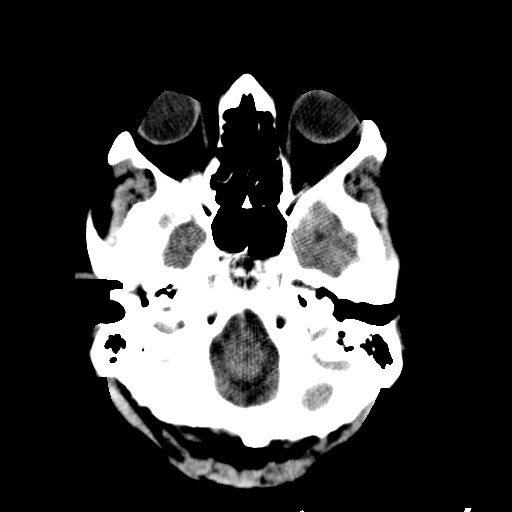
[im 5/31  bone]
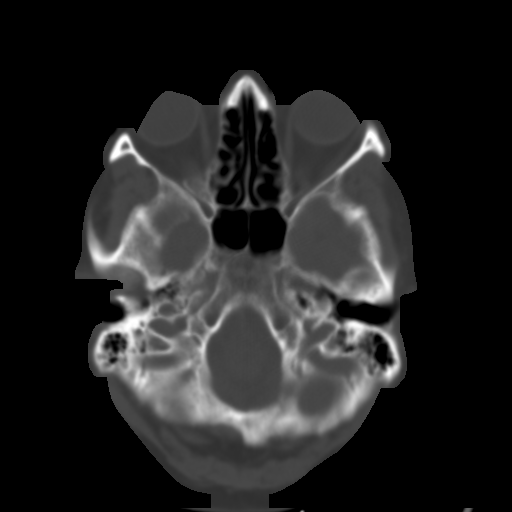
[im 9/31  brain]
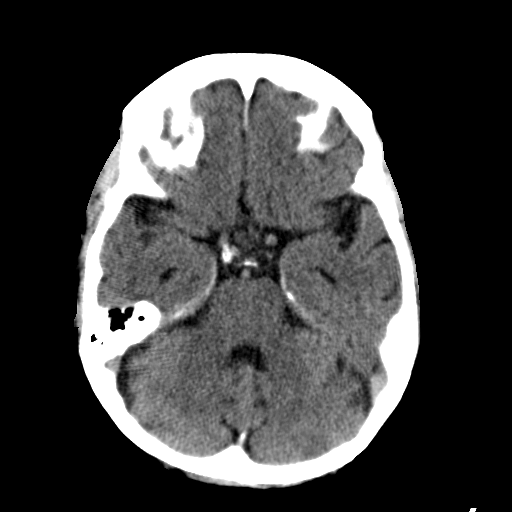
[im 13/31  brain]
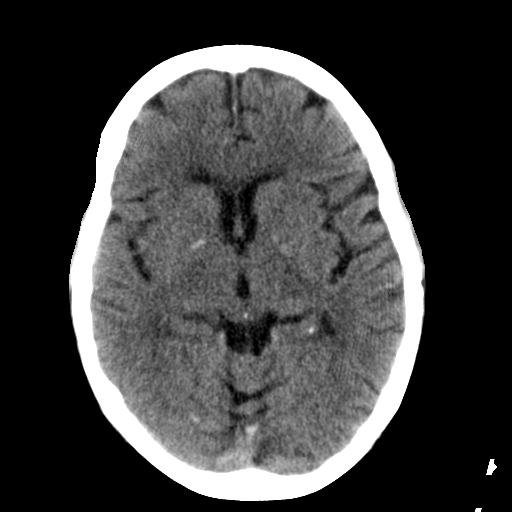
[im 18/31  brain]
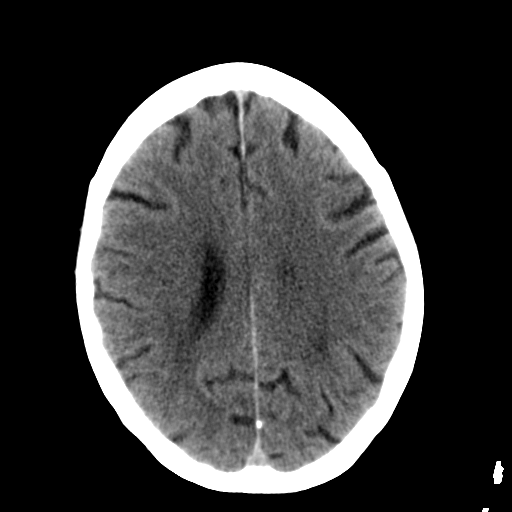
[im 22/31  brain]
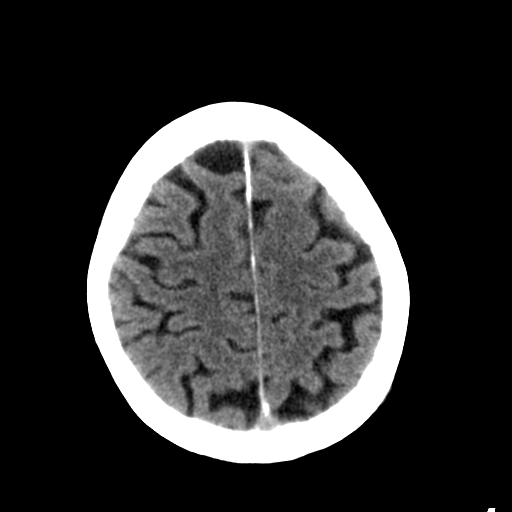
[im 22/31  bone]
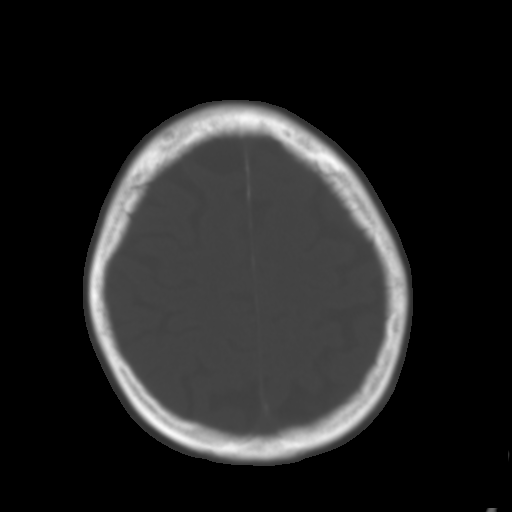
[im 26/31  brain]
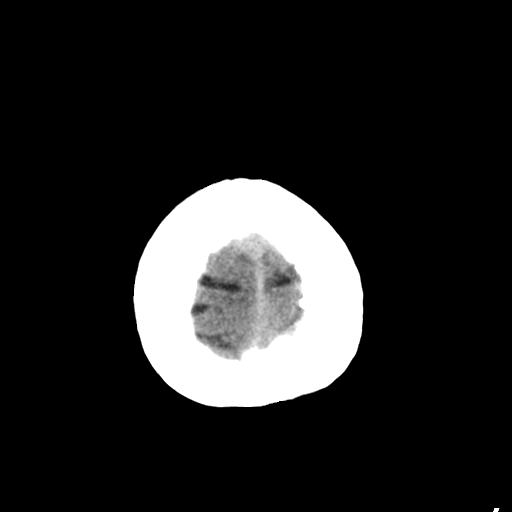

[Series 5: bone 2 · axial · 0.39mm/px · z∈[+925,+1024]mm · 6 of 30 slices shown]
[im 5/30  bone]
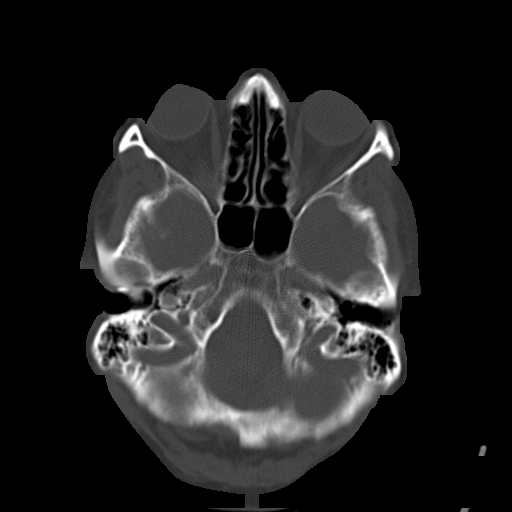
[im 9/30  bone]
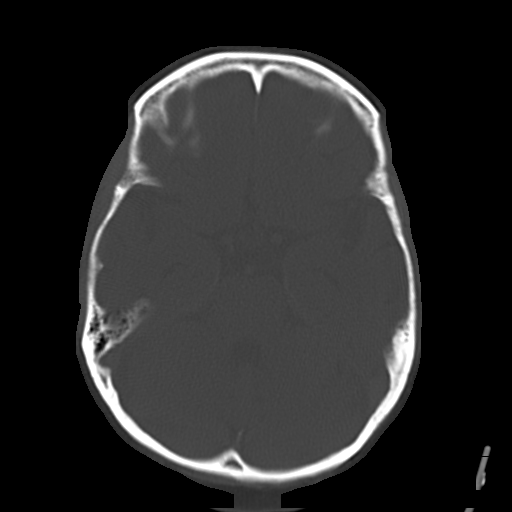
[im 13/30  bone]
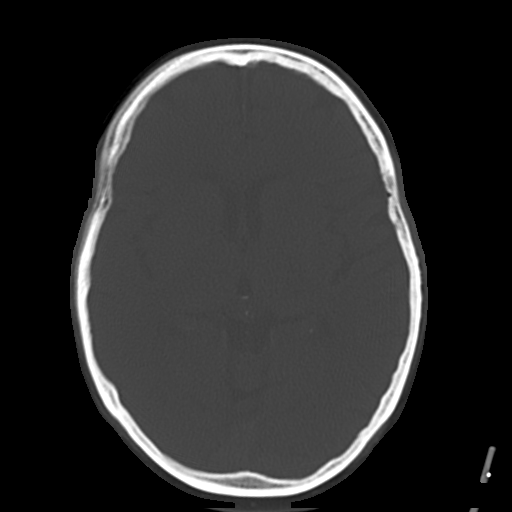
[im 17/30  bone]
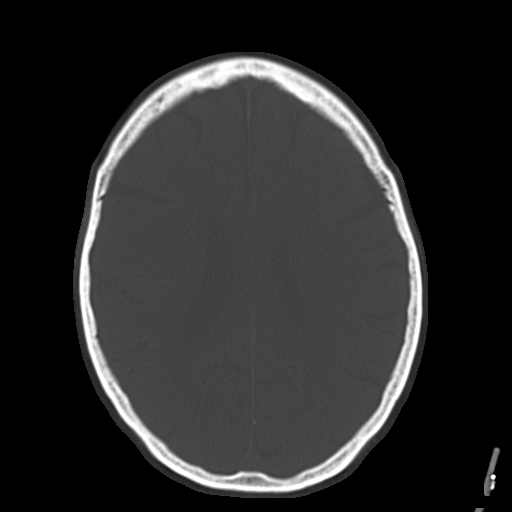
[im 21/30  bone]
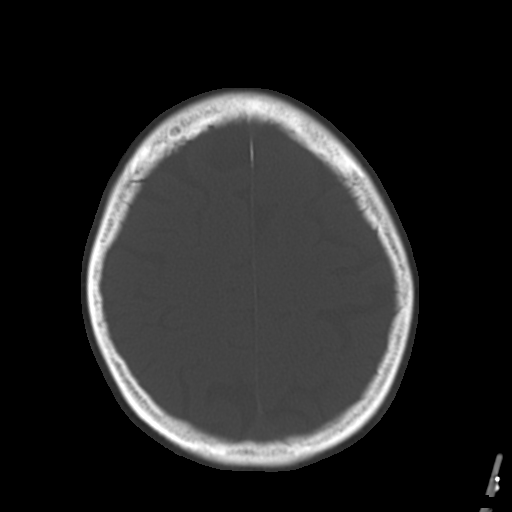
[im 25/30  bone]
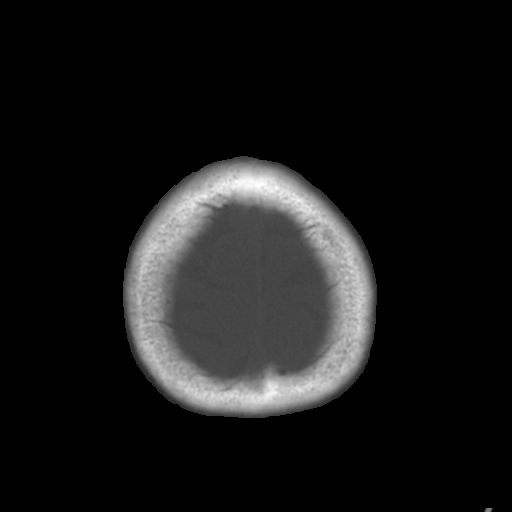

[17 of 30 positions shown; findings below may reference images not displayed]

FINDINGS: Standard CT obtained. No mass. No hydrocephalus. No hemorrhage.
No acute bony abnormality identified.
IMPRESSION: No acute abnormality.

## 2014-02-28 ENCOUNTER — Ambulatory Visit: Payer: Self-pay | Admitting: Hospice and Palliative Medicine

## 2014-03-09 ENCOUNTER — Ambulatory Visit: Payer: Self-pay | Admitting: Internal Medicine

## 2014-03-10 ENCOUNTER — Inpatient Hospital Stay: Payer: Self-pay | Admitting: Internal Medicine

## 2014-03-10 LAB — URINALYSIS, COMPLETE
Bilirubin,UR: NEGATIVE
Blood: NEGATIVE
Glucose,UR: NEGATIVE mg/dL (ref 0–75)
NITRITE: NEGATIVE
PH: 7 (ref 4.5–8.0)
RBC,UR: 14 /HPF (ref 0–5)
Specific Gravity: 1.011 (ref 1.003–1.030)
Squamous Epithelial: 4
WBC UR: 1251 /HPF (ref 0–5)

## 2014-03-10 LAB — CBC
HCT: 37.2 % (ref 35.0–47.0)
HGB: 12.1 g/dL (ref 12.0–16.0)
MCH: 27.9 pg (ref 26.0–34.0)
MCHC: 32.6 g/dL (ref 32.0–36.0)
MCV: 85 fL (ref 80–100)
Platelet: 230 10*3/uL (ref 150–440)
RBC: 4.36 10*6/uL (ref 3.80–5.20)
RDW: 15.5 % — ABNORMAL HIGH (ref 11.5–14.5)
WBC: 9.1 10*3/uL (ref 3.6–11.0)

## 2014-03-10 LAB — BASIC METABOLIC PANEL
Anion Gap: 7 (ref 7–16)
BUN: 24 mg/dL — ABNORMAL HIGH (ref 7–18)
CALCIUM: 10.2 mg/dL — AB (ref 8.5–10.1)
CHLORIDE: 101 mmol/L (ref 98–107)
Co2: 30 mmol/L (ref 21–32)
Creatinine: 1.73 mg/dL — ABNORMAL HIGH (ref 0.60–1.30)
EGFR (Non-African Amer.): 30 — ABNORMAL LOW
GFR CALC AF AMER: 36 — AB
GLUCOSE: 139 mg/dL — AB (ref 65–99)
OSMOLALITY: 282 (ref 275–301)
Potassium: 4.6 mmol/L (ref 3.5–5.1)
Sodium: 138 mmol/L (ref 136–145)

## 2014-03-10 LAB — TROPONIN I: Troponin-I: <0.02 ng/mL

## 2014-03-11 LAB — CBC WITH DIFFERENTIAL/PLATELET
BASOS ABS: 0.1 10*3/uL (ref 0.0–0.1)
BASOS ABS: 0.1 10*3/uL (ref 0.0–0.1)
Basophil %: 0.8 %
Basophil %: 0.3 %
EOS ABS: 0 10*3/uL (ref 0.0–0.7)
EOS PCT: 4.2 %
EOS PCT: 0.1 %
Eosinophil #: 0.3 10*3/uL (ref 0.0–0.7)
HCT: 37 % (ref 35.0–47.0)
HCT: 36.8 % (ref 35.0–47.0)
HGB: 12.1 g/dL (ref 12.0–16.0)
HGB: 11.9 g/dL — AB (ref 12.0–16.0)
LYMPHS ABS: 1.6 10*3/uL (ref 1.0–3.6)
Lymphocyte #: 2.3 10*3/uL (ref 1.0–3.6)
Lymphocyte %: 30.7 %
Lymphocyte %: 10.5 %
MCH: 28 pg (ref 26.0–34.0)
MCH: 27.4 pg (ref 26.0–34.0)
MCHC: 32.7 g/dL (ref 32.0–36.0)
MCHC: 32.3 g/dL (ref 32.0–36.0)
MCV: 86 fL (ref 80–100)
MCV: 85 fL (ref 80–100)
MONO ABS: 1.1 x10 3/mm — AB (ref 0.2–0.9)
MONOS PCT: 11.2 %
Monocyte #: 0.9 x10 3/mm (ref 0.2–0.9)
Monocyte %: 7.5 %
NEUTROS ABS: 4.1 10*3/uL (ref 1.4–6.5)
NEUTROS ABS: 12.3 10*3/uL — AB (ref 1.4–6.5)
Neutrophil %: 53.1 %
Neutrophil %: 81.6 %
Platelet: 190 10*3/uL (ref 150–440)
Platelet: 209 10*3/uL (ref 150–440)
RBC: 4.32 10*6/uL (ref 3.80–5.20)
RBC: 4.34 10*6/uL (ref 3.80–5.20)
RDW: 15.4 % — ABNORMAL HIGH (ref 11.5–14.5)
RDW: 15.2 % — ABNORMAL HIGH (ref 11.5–14.5)
WBC: 7.6 10*3/uL (ref 3.6–11.0)
WBC: 15 10*3/uL — ABNORMAL HIGH (ref 3.6–11.0)

## 2014-03-11 LAB — COMPREHENSIVE METABOLIC PANEL
ALT: 19 U/L
Albumin: 3.3 g/dL — ABNORMAL LOW (ref 3.4–5.0)
Alkaline Phosphatase: 49 U/L
Anion Gap: 15 (ref 7–16)
BUN: 22 mg/dL — ABNORMAL HIGH (ref 7–18)
Bilirubin,Total: 0.4 mg/dL (ref 0.2–1.0)
Calcium, Total: 9.7 mg/dL (ref 8.5–10.1)
Chloride: 102 mmol/L (ref 98–107)
Co2: 21 mmol/L (ref 21–32)
Creatinine: 1.54 mg/dL — ABNORMAL HIGH (ref 0.60–1.30)
EGFR (African American): 41 — ABNORMAL LOW
EGFR (Non-African Amer.): 34 — ABNORMAL LOW
Glucose: 170 mg/dL — ABNORMAL HIGH (ref 65–99)
OSMOLALITY: 283 (ref 275–301)
Potassium: 3.8 mmol/L (ref 3.5–5.1)
SGOT(AST): 22 U/L (ref 15–37)
Sodium: 138 mmol/L (ref 136–145)
Total Protein: 7 g/dL (ref 6.4–8.2)

## 2014-03-11 LAB — BASIC METABOLIC PANEL
Anion Gap: 10 (ref 7–16)
BUN: 21 mg/dL — AB (ref 7–18)
CHLORIDE: 105 mmol/L (ref 98–107)
Calcium, Total: 9.6 mg/dL (ref 8.5–10.1)
Co2: 27 mmol/L (ref 21–32)
Creatinine: 1.6 mg/dL — ABNORMAL HIGH (ref 0.60–1.30)
GFR CALC AF AMER: 39 — AB
GFR CALC NON AF AMER: 32 — AB
GLUCOSE: 92 mg/dL (ref 65–99)
Osmolality: 286 (ref 275–301)
POTASSIUM: 3.9 mmol/L (ref 3.5–5.1)
SODIUM: 142 mmol/L (ref 136–145)

## 2014-03-11 LAB — LIPID PANEL
CHOLESTEROL: 253 mg/dL — AB (ref 0–200)
HDL Cholesterol: 30 mg/dL — ABNORMAL LOW (ref 40–60)
Ldl Cholesterol, Calc: 179 mg/dL — ABNORMAL HIGH (ref 0–100)
Triglycerides: 218 mg/dL — ABNORMAL HIGH (ref 0–200)
VLDL Cholesterol, Calc: 44 mg/dL — ABNORMAL HIGH (ref 5–40)

## 2014-03-11 LAB — TROPONIN I

## 2014-03-12 LAB — CBC WITH DIFFERENTIAL/PLATELET
BASOS ABS: 0 10*3/uL (ref 0.0–0.1)
Basophil %: 0.3 %
EOS ABS: 0 10*3/uL (ref 0.0–0.7)
Eosinophil %: 0.1 %
HCT: 31.4 % — AB (ref 35.0–47.0)
HGB: 10.5 g/dL — ABNORMAL LOW (ref 12.0–16.0)
LYMPHS ABS: 1.1 10*3/uL (ref 1.0–3.6)
LYMPHS PCT: 10.4 %
MCH: 28.2 pg (ref 26.0–34.0)
MCHC: 33.3 g/dL (ref 32.0–36.0)
MCV: 85 fL (ref 80–100)
Monocyte #: 0.7 x10 3/mm (ref 0.2–0.9)
Monocyte %: 6.1 %
NEUTROS PCT: 83.1 %
Neutrophil #: 9.1 10*3/uL — ABNORMAL HIGH (ref 1.4–6.5)
PLATELETS: 181 10*3/uL (ref 150–440)
RBC: 3.71 10*6/uL — AB (ref 3.80–5.20)
RDW: 15.2 % — ABNORMAL HIGH (ref 11.5–14.5)
WBC: 10.9 10*3/uL (ref 3.6–11.0)

## 2014-03-12 LAB — BASIC METABOLIC PANEL
Anion Gap: 12 (ref 7–16)
BUN: 23 mg/dL — AB (ref 7–18)
CALCIUM: 8.9 mg/dL (ref 8.5–10.1)
CO2: 22 mmol/L (ref 21–32)
Chloride: 105 mmol/L (ref 98–107)
Creatinine: 1.5 mg/dL — ABNORMAL HIGH (ref 0.60–1.30)
EGFR (African American): 42 — ABNORMAL LOW
EGFR (Non-African Amer.): 35 — ABNORMAL LOW
Glucose: 188 mg/dL — ABNORMAL HIGH (ref 65–99)
Osmolality: 286 (ref 275–301)
POTASSIUM: 4 mmol/L (ref 3.5–5.1)
SODIUM: 139 mmol/L (ref 136–145)

## 2014-03-12 LAB — PHOSPHORUS: PHOSPHORUS: 3.5 mg/dL (ref 2.5–4.9)

## 2014-03-12 LAB — TSH: THYROID STIMULATING HORM: 4.24 u[IU]/mL

## 2014-03-12 LAB — MAGNESIUM: MAGNESIUM: 2 mg/dL

## 2014-03-13 LAB — CBC WITH DIFFERENTIAL/PLATELET
BASOS PCT: 0.4 %
Basophil #: 0 10*3/uL (ref 0.0–0.1)
EOS PCT: 4.8 %
Eosinophil #: 0.5 10*3/uL (ref 0.0–0.7)
HCT: 26.4 % — ABNORMAL LOW (ref 35.0–47.0)
HGB: 9 g/dL — ABNORMAL LOW (ref 12.0–16.0)
Lymphocyte #: 1.7 10*3/uL (ref 1.0–3.6)
Lymphocyte %: 17 %
MCH: 28.7 pg (ref 26.0–34.0)
MCHC: 33.9 g/dL (ref 32.0–36.0)
MCV: 85 fL (ref 80–100)
Monocyte #: 0.9 x10 3/mm (ref 0.2–0.9)
Monocyte %: 9.3 %
Neutrophil #: 6.7 10*3/uL — ABNORMAL HIGH (ref 1.4–6.5)
Neutrophil %: 68.5 %
PLATELETS: 147 10*3/uL — AB (ref 150–440)
RBC: 3.12 10*6/uL — ABNORMAL LOW (ref 3.80–5.20)
RDW: 15.6 % — ABNORMAL HIGH (ref 11.5–14.5)
WBC: 9.7 10*3/uL (ref 3.6–11.0)

## 2014-03-13 LAB — COMPREHENSIVE METABOLIC PANEL
Albumin: 2.3 g/dL — ABNORMAL LOW (ref 3.4–5.0)
Alkaline Phosphatase: 42 U/L — ABNORMAL LOW
Anion Gap: 12 (ref 7–16)
BUN: 25 mg/dL — ABNORMAL HIGH (ref 7–18)
Bilirubin,Total: 0.3 mg/dL (ref 0.2–1.0)
CREATININE: 1.33 mg/dL — AB (ref 0.60–1.30)
Calcium, Total: 7.7 mg/dL — ABNORMAL LOW (ref 8.5–10.1)
Chloride: 112 mmol/L — ABNORMAL HIGH (ref 98–107)
Co2: 20 mmol/L — ABNORMAL LOW (ref 21–32)
EGFR (African American): 49 — ABNORMAL LOW
EGFR (Non-African Amer.): 40 — ABNORMAL LOW
Glucose: 161 mg/dL — ABNORMAL HIGH (ref 65–99)
Osmolality: 295 (ref 275–301)
POTASSIUM: 3.1 mmol/L — AB (ref 3.5–5.1)
SGOT(AST): 28 U/L (ref 15–37)
SGPT (ALT): 16 U/L
Sodium: 144 mmol/L (ref 136–145)
Total Protein: 5.4 g/dL — ABNORMAL LOW (ref 6.4–8.2)

## 2014-03-13 LAB — MAGNESIUM
MAGNESIUM: 1.6 mg/dL — AB
Magnesium: 1.7 mg/dL — ABNORMAL LOW

## 2014-03-13 LAB — PHOSPHORUS
Phosphorus: 2.1 mg/dL — ABNORMAL LOW (ref 2.5–4.9)
Phosphorus: 2.8 mg/dL (ref 2.5–4.9)

## 2014-03-13 LAB — POTASSIUM: POTASSIUM: 4 mmol/L (ref 3.5–5.1)

## 2014-03-13 LAB — URINE CULTURE

## 2014-03-14 LAB — CBC WITH DIFFERENTIAL/PLATELET
BASOS PCT: 0.4 %
Basophil #: 0.1 10*3/uL (ref 0.0–0.1)
EOS ABS: 0.3 10*3/uL (ref 0.0–0.7)
EOS PCT: 2.8 %
HCT: 27.8 % — ABNORMAL LOW (ref 35.0–47.0)
HGB: 9.2 g/dL — ABNORMAL LOW (ref 12.0–16.0)
LYMPHS ABS: 2 10*3/uL (ref 1.0–3.6)
Lymphocyte %: 16.8 %
MCH: 28.3 pg (ref 26.0–34.0)
MCHC: 33.2 g/dL (ref 32.0–36.0)
MCV: 85 fL (ref 80–100)
Monocyte #: 1.1 x10 3/mm — ABNORMAL HIGH (ref 0.2–0.9)
Monocyte %: 9 %
Neutrophil #: 8.4 10*3/uL — ABNORMAL HIGH (ref 1.4–6.5)
Neutrophil %: 71 %
Platelet: 152 10*3/uL (ref 150–440)
RBC: 3.26 10*6/uL — ABNORMAL LOW (ref 3.80–5.20)
RDW: 16 % — ABNORMAL HIGH (ref 11.5–14.5)
WBC: 11.9 10*3/uL — AB (ref 3.6–11.0)

## 2014-03-14 LAB — BASIC METABOLIC PANEL
ANION GAP: 8 (ref 7–16)
BUN: 21 mg/dL — AB (ref 7–18)
Calcium, Total: 7.8 mg/dL — ABNORMAL LOW (ref 8.5–10.1)
Chloride: 112 mmol/L — ABNORMAL HIGH (ref 98–107)
Co2: 21 mmol/L (ref 21–32)
Creatinine: 1.09 mg/dL (ref 0.60–1.30)
EGFR (Non-African Amer.): 51 — ABNORMAL LOW
Glucose: 186 mg/dL — ABNORMAL HIGH (ref 65–99)
OSMOLALITY: 289 (ref 275–301)
Potassium: 3.8 mmol/L (ref 3.5–5.1)
SODIUM: 141 mmol/L (ref 136–145)

## 2014-03-14 LAB — MAGNESIUM: Magnesium: 1.9 mg/dL

## 2014-03-15 LAB — BASIC METABOLIC PANEL
ANION GAP: 8 (ref 7–16)
BUN: 28 mg/dL — ABNORMAL HIGH (ref 7–18)
CREATININE: 1.21 mg/dL (ref 0.60–1.30)
Calcium, Total: 7.4 mg/dL — ABNORMAL LOW (ref 8.5–10.1)
Chloride: 108 mmol/L — ABNORMAL HIGH (ref 98–107)
Co2: 20 mmol/L — ABNORMAL LOW (ref 21–32)
EGFR (African American): 54 — ABNORMAL LOW
GFR CALC NON AF AMER: 45 — AB
GLUCOSE: 260 mg/dL — AB (ref 65–99)
Osmolality: 286 (ref 275–301)
Potassium: 3.5 mmol/L (ref 3.5–5.1)
SODIUM: 136 mmol/L (ref 136–145)

## 2014-03-15 LAB — CBC WITH DIFFERENTIAL/PLATELET
BASOS ABS: 0.1 10*3/uL (ref 0.0–0.1)
Basophil %: 0.5 %
Eosinophil #: 0.2 10*3/uL (ref 0.0–0.7)
Eosinophil %: 1.7 %
HCT: 24.9 % — AB (ref 35.0–47.0)
HGB: 8.3 g/dL — ABNORMAL LOW (ref 12.0–16.0)
Lymphocyte #: 0.4 10*3/uL — ABNORMAL LOW (ref 1.0–3.6)
Lymphocyte %: 4.2 %
MCH: 28.6 pg (ref 26.0–34.0)
MCHC: 33.3 g/dL (ref 32.0–36.0)
MCV: 86 fL (ref 80–100)
MONO ABS: 0.4 x10 3/mm (ref 0.2–0.9)
MONOS PCT: 4 %
Neutrophil #: 9.4 10*3/uL — ABNORMAL HIGH (ref 1.4–6.5)
Neutrophil %: 89.6 %
PLATELETS: 137 10*3/uL — AB (ref 150–440)
RBC: 2.9 10*6/uL — ABNORMAL LOW (ref 3.80–5.20)
RDW: 15.7 % — AB (ref 11.5–14.5)
WBC: 10.6 10*3/uL (ref 3.6–11.0)

## 2014-03-15 LAB — CULTURE, BLOOD (SINGLE)

## 2014-03-15 LAB — SEDIMENTATION RATE: Erythrocyte Sed Rate: 124 mm/hr — ABNORMAL HIGH (ref 0–30)

## 2014-03-16 LAB — URINALYSIS, COMPLETE
BILIRUBIN, UR: NEGATIVE
Blood: NEGATIVE
Glucose,UR: NEGATIVE mg/dL (ref 0–75)
Ketone: NEGATIVE
Nitrite: NEGATIVE
Ph: 5 (ref 4.5–8.0)
Protein: NEGATIVE
RBC,UR: 7 /HPF (ref 0–5)
Specific Gravity: 1.008 (ref 1.003–1.030)
WBC UR: 16 /HPF (ref 0–5)

## 2014-03-16 LAB — CREATININE, SERUM
CREATININE: 1.14 mg/dL (ref 0.60–1.30)
EGFR (Non-African Amer.): 48 — ABNORMAL LOW
GFR CALC AF AMER: 58 — AB

## 2014-03-17 LAB — COMPREHENSIVE METABOLIC PANEL
ALBUMIN: 1.6 g/dL — AB (ref 3.4–5.0)
Alkaline Phosphatase: 54 U/L
Anion Gap: 8 (ref 7–16)
BUN: 27 mg/dL — ABNORMAL HIGH (ref 7–18)
Bilirubin,Total: 0.3 mg/dL (ref 0.2–1.0)
CALCIUM: 8.4 mg/dL — AB (ref 8.5–10.1)
CREATININE: 1.22 mg/dL (ref 0.60–1.30)
Chloride: 111 mmol/L — ABNORMAL HIGH (ref 98–107)
Co2: 20 mmol/L — ABNORMAL LOW (ref 21–32)
EGFR (African American): 54 — ABNORMAL LOW
EGFR (Non-African Amer.): 44 — ABNORMAL LOW
Glucose: 166 mg/dL — ABNORMAL HIGH (ref 65–99)
OSMOLALITY: 286 (ref 275–301)
POTASSIUM: 3.1 mmol/L — AB (ref 3.5–5.1)
SGOT(AST): 25 U/L (ref 15–37)
SGPT (ALT): 34 U/L
Sodium: 139 mmol/L (ref 136–145)
TOTAL PROTEIN: 5 g/dL — AB (ref 6.4–8.2)

## 2014-03-17 LAB — CBC WITH DIFFERENTIAL/PLATELET
BASOS ABS: 0 10*3/uL (ref 0.0–0.1)
Basophil %: 0.5 %
Eosinophil #: 0.5 10*3/uL (ref 0.0–0.7)
Eosinophil %: 7.2 %
HCT: 22 % — AB (ref 35.0–47.0)
HGB: 7.2 g/dL — ABNORMAL LOW (ref 12.0–16.0)
LYMPHS ABS: 0.7 10*3/uL — AB (ref 1.0–3.6)
LYMPHS PCT: 9.4 %
MCH: 28.2 pg (ref 26.0–34.0)
MCHC: 32.9 g/dL (ref 32.0–36.0)
MCV: 86 fL (ref 80–100)
MONOS PCT: 9.7 %
Monocyte #: 0.7 x10 3/mm (ref 0.2–0.9)
NEUTROS ABS: 5.2 10*3/uL (ref 1.4–6.5)
Neutrophil %: 73.2 %
Platelet: 148 10*3/uL — ABNORMAL LOW (ref 150–440)
RBC: 2.57 10*6/uL — ABNORMAL LOW (ref 3.80–5.20)
RDW: 16 % — ABNORMAL HIGH (ref 11.5–14.5)
WBC: 7.1 10*3/uL (ref 3.6–11.0)

## 2014-03-17 LAB — POTASSIUM: POTASSIUM: 3.6 mmol/L (ref 3.5–5.1)

## 2014-03-18 LAB — CREATININE, SERUM
Creatinine: 1.2 mg/dL (ref 0.60–1.30)
EGFR (African American): 55 — ABNORMAL LOW
EGFR (Non-African Amer.): 45 — ABNORMAL LOW

## 2014-03-18 LAB — VANCOMYCIN, TROUGH: Vancomycin, Trough: 29 ug/mL (ref 10–20)

## 2014-03-30 ENCOUNTER — Ambulatory Visit: Payer: Self-pay | Admitting: Hospice and Palliative Medicine

## 2014-03-30 DEATH — deceased

## 2014-08-17 NOTE — Discharge Summary (Signed)
PATIENT NAME:  [name], [name] MR#:  [id number] DATE OF BIRTH:  03-01-1926  DATE OF ADMISSION:  11/12/2011 DATE OF DISCHARGE:  11/14/2011  DISCHARGE DIAGNOSES:  1. Syncope.  2. Encephalopathy, metabolic.  3. Hyponatremia. 4. Aortic stenosis, moderate.  5. Diabetes mellitus, non-insulin requiring.  6. Hypertension.  7. Osteoarthritis.   DISCHARGE MEDICATIONS:  1. Os-Cal 600 mg daily.  2. Levothyroxine 50 mcg daily. 3. Xanax 0.25 mg daily p.r.n.  4. Glipizide/metformin 2.5/500, 1 b.i.d.  5. Losartan 100 mg daily.   REASON FOR ADMISSION: 81 year old female presents with some confusion and syncope. Please see History and Physical for history of present illness, past medical history, and physical exam.   HOSPITAL COURSE: The patient was admitted, hydrated. Sodium remained stable around 130. Thyroid studies were normal. Carotid Doppler unremarkable. Telemetry showed normal sinus rhythm. Brain MRI showed involutional changes. The patient was up and around. Tramadol was discontinued. Urinary tract infection treated with IV Rocephin and p.o. Levaquin. She had no more presyncopal/syncopal events. No sign of acute coronary ischemia. Enzymes were negative. Her/HCT portion of the losartan was discontinued. She will go on fluid restriction. It was thought that her confusion was somewhat baseline dementia with hyponatremia possibly effect of tramadol. She will not take tramadol any more for her knees.      She will follow up with Dr. Hyacinth MeekerMiller in one week.   ____________________________ Danella PentonMark F. Miller, MD mfm:bjt D: 11/14/2011 07:10:20 ET T: 11/14/2011 12:40:03 ET JOB#: [id number]  cc: Danella PentonMark F. Miller, MD, <Dictator> MARK Sherlene ShamsF MILLER MD ELECTRONICALLY SIGNED 11/15/2011 8:15

## 2014-08-21 NOTE — H&P (Signed)
PATIENT NAME:  [name], [name] MR#:  [id number] DATE OF BIRTH:  06/12/1925  DATE OF ADMISSION:  10/07/2013  ADMITTING PHYSICIAN: Dr. Enid Baas.  PRIMARY CARE PHYSICIAN: Dr. Bethann Weiss.   CHIEF COMPLAINT: Difficulty breathing.   HISTORY OF PRESENT ILLNESS: [name] is an 81 year old, very pleasant Caucasian female with past medical history significant for hypertension, diabetes, hypothyroidism, cardiac murmur, presents to the hospital secondary to worsening dyspnea and also cough. The patient states that she has been having difficulty breathing for a few days now. Denies any fevers or chills. She has been coughing up greenish-yellowish phlegm. She thought it was allergies because she is pretty sensitive to environmental allergies, and the cough is just upper respiratory infection or allergies. She stayed home, did not seek any medical attention. However, last night her breathing got very difficult and she was brought to the ER. Her signs were 80% on room air when she presented. The patient is not on any home oxygen or never been on any home oxygen. She also was noted to have a higher white count of 19,000 and so was admitted for hypoxic respiratory failure and sepsis, likely secondary to pneumonia as noted on chest x-ray.   PAST MEDICAL HISTORY: 1.  Hypertension.  2.  Non-insulin-dependent diabetes mellitus.  3.  Hypothyroidism.  4.  Heart murmur.   5.  Osteoarthritis.    PAST SURGICAL HISTORY:  1.  Partial hysterectomy.  2.  Right eye cataract surgery.   ALLERGIES TO MEDICATIONS:  1.  EPINEPHRINE. 2.  PENICILLIN CAUSES HIVES.  3.  SULFA DRUGS.  CURRENT HOME MEDICATIONS:  1.  Norvasc 5 mg p.o. daily.  2.  Losartan 100 mg p.o. daily.  3.  Aspirin 81 mg p.o. daily.  4.  Glipizide/metformin 2.5 mg/500 mg 1 tablet p.o. b.i.d.  5.  Metoprolol 50 mg once a day.  6.  Multivitamin 1 tablet p.o. daily.  7.  Synthroid 50 mcg daily.  8.  Vitamin B12, 500 mcg.  9.  Vitamin D3, 1000  international units p.o. daily.   SOCIAL HISTORY: Lives at home with her husband. Ambulates without any support, but does have a cane at home. No smoking or alcohol use. No passive smoking.   FAMILY HISTORY: Sister with history of A. fib. Dad had diabetes and died in his 52s with complications from heart attack.   REVIEW OF SYSTEMS:    CONSTITUTIONAL: No fever, fatigue, or weakness.  EYES: Positive for blurred vision, especially in the left eye. Uses reading glasses. No inflammation or glaucoma.  EARS, NOSE, THROAT: No tinnitus, ear pain. Positive for seasonal allergies. No difficulty hearing. No dysphagia.   RESPIRATORY: Positive for cough. No wheeze or hemoptysis. No COPD. Positive for dyspnea on exertion.  CARDIOVASCULAR: No chest pain, orthopnea, edema, arrhythmia, palpitations, or syncope.  GASTROINTESTINAL: No nausea, vomiting, diarrhea, abdominal pain, hematemesis, or melena.  GENITOURINARY: No dysuria, hematuria, renal calculus, frequency, or incontinence.  ENDOCRINE: No polyuria, nocturia, thyroid problems, heat or cold intolerance.  HEMATOLOGIC: No anemia, easy bruising or bleeding.  SKIN: No acne, rash, or lesions.  MUSCULOSKELETAL: No neck, back, shoulder pain, arthritis, or gout.  NEUROLOGIC: No numbness, weakness, CVA, TIA, or seizures.  PSYCHOLOGICAL: No anxiety, insomnia, or depression.   PHYSICAL EXAMINATION: VITAL SIGNS: Temperature 98.5 degrees Fahrenheit, pulse 86, respirations 18, blood pressure 111/69, pulse oximetry 94% on room air.  GENERAL: Well-built, well-nourished female lying in bed, not in any acute distress.  HEENT: Normocephalic, atraumatic. Pupils equal, round, reacting to light.  Anicteric sclerae. Extraocular movements intact. Oropharynx clear without erythema, mass, or exudates. NECK: Supple. No thyromegaly, JVD, or carotid bruits. No lymphadenopathy.  LUNGS: Moving air bilaterally. Coarse rhonchi right base and also left base, however, right worse than  left noted. No wheeze. No crackles. No use of accessory muscles for breathing.  CARDIOVASCULAR: S1, S2, regular rate and rhythm. Positive IV/VI systolic murmur heard. No rubs or gallops.  ABDOMEN: Soft, nontender, nondistended. No hepatosplenomegaly. Normal bowel sounds.  EXTREMITIES: No pedal edema, no clubbing or cyanosis, 2+ dorsalis pedis pulses palpable bilaterally.  SKIN: No acne, rash, or lesions.  LYMPHATICS: No cervical or inguinal lymphadenopathy.  NEUROLOGIC: Cranial nerves are intact. No focal motor or sensory deficits.  PSYCHOLOGICAL: The patient is awake, alert, oriented x 3.   LABORATORY AND RADIOLOGICAL DATA: Venous pH of 7.29, pCO2 of 52. WBC is 19.4, hemoglobin 11.0, hematocrit 33.7, platelet count 285.   Sodium 128, potassium 4.1, chloride 91, bicarbonate 26, BUN 24, creatinine 1.54, glucose 326, and calcium of 9.0.  ALT 16, AST 18, alkaline phosphatase 59, total bilirubin 0.7, albumin of 3.2.   D-dimer is 1597. BNP is slightly elevated at 3813.  INR is 1.1. CK, CK-MB. Troponin within normal limits.   Chest x-ray showing new right basilar pneumonia. Followup films recommended. Left lung remains clear. Also shows chronic fracture of left proximal humerus noted.   EKG showing sinus tachycardia, first degree AV block, heart rate of 106.   ASSESSMENT AND PLAN: This is an 81 year old female with history of hypertension, diabetes, hypothyroidism, admitted for sepsis from pneumonia.  1.  Sepsis with elevated WBC and respiratory distress secondary to pneumonia, slightly tachycardic also on admission. Blood cultures have been ordered. Started on IV Levaquin and fluids.  2.  Acute hypoxic respiratory failure secondary to pneumonia, not on home oxygen, currently on 4 liters. Continue Levaquin and also add nebulizers p.r.n. for reactive airway disease. 3.  Hypertension: Continue medications.  4.  Diabetes mellitus: On glipizide and metformin, will continue. Add sliding scale  insulin.  5.  Hypothyroidism: On Synthroid.  6.  Deep vein thrombosis prophylaxis with subcutaneous heparin.   CODE STATUS: Full code.   TIME SPENT ON ADMISSION: 50 minutes.   ____________________________ Enid Baasadhika Amilia Vandenbrink, MD rk:jcm D: 10/07/2013 14:30:33 ET T: 10/07/2013 16:23:53 ET JOB#: [id number]  cc: Enid Baasadhika Leahanna Buser, MD, <Dictator> Danella PentonMark F. Miller, MD Enid BaasADHIKA Qianna Clagett MD ELECTRONICALLY SIGNED 10/10/2013 15:49

## 2014-08-21 NOTE — Consult Note (Signed)
PATIENT NAME:  [name], BOOZE MR#:  [id number] DATE OF BIRTH:  [birth year]  DATE OF CONSULTATION:  10/12/2013  REFERRING PHYSICIAN:  Stann Mainland. Sampson Goon, MD CONSULTING PHYSICIAN:  Lamar Blinks, MD  REASON FOR CONSULTATION:  Tachycardia with aortic valve stenosis, hypertension and respiratory failure.   CHIEF COMPLAINT: "I'm short of breath."   HISTORY OF PRESENT ILLNESS: This is an 81 year old female with known aortic valve stenosis in the moderate range with significant murmur who has had some tachycardia in the past as well as hypertension and hyperlipidemia, on appropriate medications. She has had new onset of significant shortness of breath, weakness, fatigue and cough and hypoxia with significant bronchitis, needing supplemental oxygen and BiPAP machine for which she is slowly improved.  The patient has had some tachycardia which appears to be sinus tachycardia, more consistent with current illness rather than atrial fibrillation and the patient had 4 to 5 beats of wide complex, asymptomatic, also not likely secondary to cardiac origin. The patient has had an EKG at this time with nonspecific ST changes. There has been minimal elevation of troponin more consistent with demand ischemia and no current evidence of myocardial infarction. She is slowly improving with these symptoms and more stable.   REVIEW OF SYSTEMS: The remainder of review of systems is negative for vision change, ringing in the ears, hearing loss, heartburn, nausea, vomiting, diarrhea, bloody stools, stomach pain, extremity pain, buttocks. No blood clots, headaches, blackouts, nosebleeds, congestion, trouble swallowing, frequent urination, urination at night, muscle weakness, numbness, anxiety, depression, skin lesions or skin rashes.   PAST MEDICAL HISTORY:   1.  Aortic stenosis.  2.  Tachycardia.   FAMILY HISTORY: No family members with early onset of cardiovascular disease or hypertension.   SOCIAL HISTORY: She  currently denies alcohol or tobacco use.   ALLERGIES:  AS LISTED.   MEDICATIONS: As listed.   PHYSICAL EXAMINATION: VITAL SIGNS: Blood pressure is 110/64 bilaterally, heart rate is 110 upright, reclining and slightly irregular.  GENERAL: She is a well-appearing female in no acute distress.  HEENT: No icterus, thyromegaly, ulcers, hemorrhage or xanthelasma.  CARDIOVASCULAR:  Tachycardic with normal S1, soft S2 with a 3 out of 6 right upper sternal border murmur radiating throughout. PMI is diffuse. Carotid upstroke normal without bruit. Jugular venous pressure is normal.  LUNGS: Have bibasilar crackles with expiratory wheezes.  ABDOMEN: Soft, nontender. No apparent hepatosplenomegaly or masses.  EXTREMITIES: Show 2+ radial, femoral, dorsal pedal pulses with trace to 1+ lower extremity edema. No cyanosis, clubbing or ulcers.  NEUROLOGIC: She is oriented to time, place and person with normal mood and affect.   ASSESSMENT: An 81 year old female with tachycardia, aortic valve stenosis with acute bronchitis and hypoxia, multifactorial in nature and needing continued medical management.   RECOMMENDATIONS: 1.  Gentle addition of beta blocker if necessary for tachycardia.  2.  No further intervention of aortic valve stenosis, moderate in nature, not associated with current symptoms.  3.  Continue appropriate and aggressive pulmonary toilet and treatment lung disease and hypoxia.  4.  No further intervention of elevated troponin most consistent with current illness and demand ischemia.  5.  Possible discontinuation of amlodipine due to lower extremity edema as well as blood pressure being lower at this time and reintroduction after ambulation.  6.  Continue telemetry for other rhythm disturbances, following for need and adjustments of medications.  7.  Recheck with ambulation.   ____________________________ Lamar Blinks, MD bjk:cs D: 10/12/2013 04:54:09 ET T: 10/12/2013 18:28:33  ET JOB#: [id number]  cc: Lamar BlinksBruce J. Gervase Colberg, MD, <Dictator> Lamar BlinksBRUCE J Elgene Coral MD ELECTRONICALLY SIGNED 10/13/2013 16:56

## 2014-08-21 NOTE — H&P (Signed)
PATIENT NAME:  [name], [name] MR#:  [id number] DATE OF BIRTH:  Feb 06, 1926  DATE OF ADMISSION:  03/10/2014  PRIMARY CARE PHYSICIAN: Danella Penton, MD  REFERRING EMERGENCY ROOM PHYSICIAN: Randon Goldsmith. Shaune Pollack, MD  CHIEF COMPLAINT: Speech problem.   HISTORY OF PRESENT ILLNESS: An 81 year old female who has a past history of hypertension, diabetes, hypothyroidism, heart murmurs and osteoarthritis. She lives with her husband. At baseline, she is able to walk without any support at home. Sometimes, rarely, she requires a cane. They lost their daughter 2 weeks k, and since then she is grieving. Yesterday, her husband noticed that she has speech problems. She was able to understand, but was not speaking, so he took her the primary care physician Dr. Rondel Baton office. They checked the blood pressure; it was slightly on the higher side. They ordered a CT scan of the head, which was negative, and her problem recovered and she started talking normally, so sent they sent her back home. In the night she was fine, and today morning when she woke up she was again not talking. She nods her head and follows commands. She is able to understand everything. History is mainly obtained from her husband, who says that she denied any complaints in the last few days. She did not have any urinary symptoms. No headache or any cough or fever. She was still able to walk on her own without any support. She walked in the Emergency Room also with the nurse to the bathroom.  On testing in the ER, she was noted to have UTI and so was given to the hospitalist service for further management of this issue.   REVIEW OF SYSTEMS:  CONSTITUTIONAL: Negative for fever, fatigue, weakness, pain or weight loss.  EYES: No blurring, double vision, discharge or redness.  EARS, NOSE, THROAT: No tinnitus, ear pain or hearing loss.  RESPIRATORY: No cough, wheezing, hemoptysis, or shortness of breath.  CARDIOVASCULAR: No chest, orthopnea, edema, arrhythmia  or palpitations.  GASTROINTESTINAL: No nausea, vomiting, diarrhea or abdominal pain.  GENITOURINARY: No dysuria, hematuria, or increased frequency.  ENDOCRINE: No heat or cold intolerance. No excessive sweating.  SKIN: No acne, rashes, or lesions.  MUSCULOSKELETAL: No pain or swelling in the joints.  NEUROLOGICAL: The patient has loss of speech, but no weakness, numbness, tremor or vertigo.  PSYCHIATRIC: No anxiety, insomnia, bipolar disorder.   PAST MEDICAL HISTORY:  1.  Hypertension.  2.  Non insulin dependent diabetes mellitus.  3.  Hypothyroidism.  4.  Heart murmur.  5.  Osteoarthritis.   PAST SURGICAL HISTORY:  1.  Partial hysterectomy.  2.  Right eye cataract surgery.   ALLERGIES TO MEDICATIONS:  1.  EPINEPHRINE.  2.  PENICILLIN CAUSES HIVES.  3.  SULFA DRUGS.   SOCIAL HISTORY: Lives at home with her husband. Ambulated without any support. Does have a cane at home to use sometimes. No smoking. No alcohol. No illicit drug use. After admission in June, when she was discharged to a rehab center for 2 months, and for the last 1-2 months, she is at home.   FAMILY HISTORY: Sister with a history of AFib, and father was disabled and died in his 81s with complications from a heart attack.   HOME MEDICATIONS: Still needs to be confirmed by the pharmacy technician in the ER, but as per the records:  1.  Vitamin D3 1000 international units once a day.  2.  Vitamin B12 500 mcg once a day.  3.  Synthroid 50 mcg  oral once a day.  4.  Senna 1 tablet 2 times a day.  5.  Potassium chloride 10 mEq 1 tablet once a day.  6.  Nystatin oral liquid 5 mL 4 times a day.  7.  Multivitamin tablet once a day.  8.  Metoprolol 50 mg oral once a day.  9.  Losartan 25 mg oral tablet once a day.  10.  Levaquin 500 mg oral tablet every 24 hours.  11.  Glipizide and metformin 2.5 mg oral 2 times a day.  12.  Furosemide 20 mg oral once a day.  13.  Famotidine 20 mg oral once a day.  14.  Cardizem 30 mg  tablet every 6 hours.  15.  Aspirin 81 mg once a day.  16.  Alprazolam 0.125 mg oral once a day.  17.  Acetaminophen 325 mg oral 2 tablets every 4 hours.   PHYSICAL EXAMINATION:  VITAL SIGNS: In ER, temperature 98.3, pulse is 78, respirations 25, blood pressure 90/75, pulse oximetry is 99% on room air. GENERAL: The patient is fully alert and oriented, aphasic but cooperative with history taking and physical examination.  HEENT: Head and neck atraumatic. Conjunctivae pink. Oral mucosa moist.  NECK: Supple. No JVD. No thyroid tenderness. There is a carotid bruit present on the left side of the neck.   RESPIRATORY: Bilateral equal and clear air entry. No crepitation or wheezing.  CARDIOVASCULAR: S1, S2 present, regular, systolic murmur present.  ABDOMEN: Soft, nontender. Bowel sounds present. No organomegaly.  SKIN: No rashes.  LEGS: No edema.  NEUROLOGICAL: She is aphasic but nods her head and appears to be understanding everything. She speaks 1- 2 words every now and then, but does not have very good speech. Power is 5/5 in the right upper limb, right lower limb, left upper limb, and left lower limb. No tremor or rigidity. Follows commands.  PSYCHIATRIC: Does not appear in any acute psychiatric illness at this time.   IMPORTANT LABORATORY RESULTS: Glucose 139, BUN 24, creatinine 1.73, sodium 138, potassium is 4.6, chloride 101, CO2 of 30, calcium is 10.2. Troponin less than 0.02. WBC 9.1, hemoglobin 12.1, platelet count is 230,000, MCV is 85. Urinalysis is positive with 1251 WBCs and 3+ leukocyte esterase with turbid urine.   ASSESSMENT AND PLAN: An 81 year old female who has past history of hypertension, diabetes, hypothyroidism, and heart murmur, brought to the Emergency Room after having speech problem and found having urinary tract infection.  1.  Urinary tract infection. We will give IV Levaquin and get urine culture and follow it.  2.  Aphasia, likely transient ischemic attack. This most  likely is a transient ischemic attack episode, as she recovered completely yesterday in the evening and then today morning again was having the symptoms. MRI of the brain and carotid Doppler and echocardiogram will be obtained. We will check lipid panel. Meanwhile, continue on aspirin, and we might have to give some statin. We will get a speech and swallow evaluation to assess her safety. Meanwhile, we will keep her on n.p.o. with medications.  3.  Acute renal failure. Her creatinine at baseline in June 2015 was completely normal, with GFR more than 60; currently, creatinine is 1.7. Maybe this is an effect of her grieving and urinary tract infection, slight dehydration, may be prerenal. We will give gentle hydration and will follow renal function tomorrow.  4.  History of diabetes. Because of worsening in renal function, we will hold her oral antidiabetic medication, but we will give  insulin sliding coverage.  5.  Hypertension. Currently blood pressure is slightly on the high side; maybe this is the effect of transient ischemic attack. We will continue her Cardizem and losartan, and will follow.  6.  Hypothyroidism. Continue levothyroxine.  7.  Heart murmur. Echocardiogram is ordered.   CODE STATUS: Full Code.   TOTAL TIME SPENT ON THIS ADMISSION: 50 minutes    ____________________________ Hope Pigeon Elisabeth Pigeon, MD vgv:MT D: 03/10/2014 14:59:32 ET T: 03/10/2014 15:23:40 ET JOB#: [id number]  cc: Hope Pigeon. Elisabeth Pigeon, MD, <Dictator> Danella Penton, MD Altamese Dilling MD ELECTRONICALLY SIGNED 03/29/2014 8:55

## 2014-08-21 NOTE — Consult Note (Signed)
PATIENT NAME:  [name], Makina M MR#:  [id number] DATE OF BIRTH:  04/02/1926  CARDIOLOGY CONSULTATION   DATE OF CONSULTATION:  10/09/2013  CONSULTING PHYSICIAN:  Laurier NancyShaukat A. Dezra Mandella, MD  INDICATION FOR CONSULTATION: Elevated troponin.  HISTORY OF PRESENT ILLNESS: This is an 81 year old white female with a past medical history of hypertension, diabetes, hypothyroidism, cardiac murmur, who presented to the hospital with worsening of the shortness of breath. She was found to have pneumonia with elevated white count and hypoxia. In the meantime, while she was on the floor, she got worse and was transferred to ICU in room 16 and ended up being intubated and now having a central line implanted. She had elevated troponin and abnormal EKG. Thus, I was asked to evaluate the patient. Unable to get history because the patient is intubated and sedated.   PAST MEDICAL HISTORY: Has a history of hypertension, noninsulin-dependent diabetes, hypothyroidism, heart murmur, osteoarthritis, partial hysterectomy and right eye cataract surgery.   ALLERGIES: EPINEPHRINE, PENICILLIN AND SULFA DRUGS.   MEDICATIONS:  1. Norvasc 5 mg.  2. Losartan 100 mg.  3. Aspirin 81 mg.  4. Metoprolol 50 mg.   SOCIAL HISTORY: No history of EtOH abuse or smoking.   FAMILY HISTORY: Positive for atrial fibrillation and diabetes.   PHYSICAL EXAMINATION:  GENERAL: She is alert and oriented x0.  VITAL SIGNS: Show blood pressure 120/70, respirations 20, pulse 108, temperature 98.9, saturation 92.  NECK: Revealed positive JVD.  LUNGS: Good air entry. A lot of diffuse rhonchi associated with lung exam.  HEART: Tachycardic. Normal S1, S2. No audible murmur.   DIAGNOSTIC STUDIES: Her EKG shows sinus tachycardia at 106 beats per minute, ST changes about 1.5 mm in leads II, III, aVF, V3 to V6, suggestive of ischemia. These appear to be downsloping. Chest x-ray shows finding of multilobar pneumonia, pleural effusion. Troponin is 0.63. White  count is 19.2, hemoglobin is 9.7. BUN is 41, creatinine 1.25.   ASSESSMENT AND PLAN: The patient has multilobar pneumonia associated with ischemic electrocardiogram changes inferolaterally, is having sinus tachycardia and had respiratory failure and got intubated, has only mildly elevated troponin. Advise getting echocardiogram to evaluate for wall motion. In the meantime, advise the patient being on Lovenox and aspirin and continue the rest of the medications. The patient has been already placed on aspirin and Lovenox, and we agree with this treatment plan.   Thank you very much for referral.   ____________________________ Laurier NancyShaukat A. Rayanna Matusik, MD sak:lb D: 10/09/2013 11:28:12 ET T: 10/09/2013 11:34:55 ET JOB#: [id number]  cc: Laurier NancyShaukat A. Tameria Patti, MD, <Dictator> Laurier NancySHAUKAT A Tavis Kring MD ELECTRONICALLY SIGNED 11/11/2013 9:22

## 2014-08-21 NOTE — Discharge Summary (Signed)
PATIENT NAME:  [name], [name] MR#:  [id number] DATE OF BIRTH:  [birth year]  DATE OF ADMISSION:  10/07/2013 DATE OF DISCHARGE:  10/20/2013  DISCHARGE DIAGNOSES:  1.  Bilateral pneumonia with acute respiratory failure.  2.  Acute systolic congestive heart failure with bilateral pleural effusions. 3.  Thrush. 4.  Diabetes mellitus, non-insulin-requiring.  5.  Severe aortic stenosis.  6.  Positive cardiac enzymes, stress-related.  7.  Hypertension.   DISCHARGE MEDICATIONS: Synthroid 50 mcg daily; aspirin 81 mg daily; vitamin B12 500 mcg daily; vitamin D3 1000 units daily; Toprol-XL 50 mg daily; losartan 25 mg daily; glipizide and metformin 2.5/500 b.i.d.; Tylenol 650 mg q.4 hour p.r.n.; Xanax 0.125 mg at bedtime p.r.n. anxiety; diltiazem 30 mg q.6 hours; Lasix 20 mg daily; famotidine 20 mg daily; potassium chloride 10 mEq daily; nystatin 5 mL q.i.d. x 7 days and Levaquin 500 mg daily x 5 days.   REASON FOR ADMISSION: An 81 year old female who presents with respiratory failure. Please see H and P for HPI, past medical history and physical exam.   HOSPITAL COURSE: The patient was admitted, placed aggressively on antibiotics and steroids. Her respiratory situation declined rapidly. She was intubated and was on the respirator for 48 hours and was subsequently extubated on BiPAP. A chest CT did not show pulmonary embolus, but showed bilateral infiltrates with pleural effusions. She was diuresed aggressively. Her echocardiogram showed EF of 45% to 50% with severe AS. She did not need surgery on her AS. With diuresis and further antibiotics, she was eventually weaned off O2. She remained very weak and will need nursing home skilled placement. With her high steroids, she originally needed IV insulin and that was discontinued on a routine basis as she came off of the steroids completely. She is currently off O2 and will be going to skilled nursing for PT and OT. She will need a followup MET-B in 5 days.   ____________________________ Rusty Aus, MD mfm:aw D: 10/20/2013 07:54:03 ET T: 10/20/2013 08:23:30 ET JOB#: [id number]  cc: Rusty Aus, MD, <Dictator> MARK Roselee Culver MD ELECTRONICALLY SIGNED 10/22/2013 7:55

## 2014-08-21 NOTE — Discharge Summary (Signed)
PATIENT NAME:  [name], Sharlee M MR#:  [id number] DATE OF BIRTH:  [birth year]  DATE OF ADMISSION:  03/10/2014 DATE OF DISCHARGE:  03/19/2014  DISCHARGE DIAGNOSES: 1.  Large frontal acute cerebral vascular accident.  2.  Status epilepticus, due to acute cerebrovascular accident.  3.  Respiratory failure.  4.  Urinary tract infection.  5.  Left lower lobe pneumonia.  6.  Severe aortic stenosis.  7.  Three-vessel coronary artery disease.   DISCHARGE MEDICATIONS: Morphine liquid p.r.n.   REASON FOR ADMISSION: An 81 year old female presents with asphasia and seizures. Please see H and P for HPI, past medical history, and physical exam.   HOSPITAL COURSE: The patient was admitted, found to have a large left parietal frontal CVA. She went into recurrent seizures and was intubated for airway protection. She had progressive unresponsiveness. Dr. Katrinka BlazingSmith of neurology saw her and multiple different epileptics and ultimately steroids were given really with no significant improvement. The patient was extubated with no improvement and was ultimately made comfort care.    ____________________________ Danella PentonMark F. Sacha Topor, MD mfm:at D: 03/19/2014 07:42:03 ET T: 03/19/2014 14:37:31 ET JOB#: [id number]  cc: Danella PentonMark F. Kemaria Dedic, MD, <Dictator> Kendle Erker Sherlene ShamsF Ryen Rhames MD ELECTRONICALLY SIGNED 2013/07/13 10:08

## 2014-08-21 NOTE — Consult Note (Signed)
   Comments   I spoke again with pt's husband and son. Both confirm DNR. Will change code status to reflect this.   Electronic Signatures: Xiara Knisley, Daryl EasternJoshua R (NP)  (Signed 17-Nov-15 12:14)  Authored: Palliative Care   Last Updated: 17-Nov-15 12:14 by Malachy MoanBorders, Macklen Wilhoite R (NP)

## 2014-08-21 NOTE — Consult Note (Signed)
Referring Physician:   Rusty Aus   Primary Care Physician:   Merdis Delay, [address] [place], [place] [address], [place] [phone]  Reason for Consult: Admit Date: 10-Mar-2014  Chief Complaint: speech problems  Reason for Consult: CVA   History of Present Illness: History of Present Illness:   81 yo RHD F presents to [facility] secondary to speech difficulty that started 2 days prior.  Pt went to another clinic and had a negative CT as well as improvement in speech.  However, thursday she was noted to have worsening speech and was brought to [facility].  Per family, pt has not had any weakness.  Today in the hospital she had several episodes of blank stares and then had a few seizures and was transferred to ICU.  She had a large seizure and was then intubated.  ROS:  Review of Systems   unobtainable secondary to mental status  Past Medical/Surgical Hx:  Hypothyroidism:   Hypertension:   Fracture: right shoulder  Arthritis:   Diabetes Mellitus, Type II (NIDD):   Hysterectomy - Partial:   Past Medical/ Surgical Hx:  Past Medical History personally reviewed by me as above   Past Surgical History personally reviewed by me as above   Home Medications: Medication Instructions Last Modified Date/Time  Levaquin 500 mg oral tablet 1 tab(s) orally every 24 hours 24-Jun-15 12:49  nystatin 100000 units/mL oral suspension 5 milliliter(s) orally 4 times a day 24-Jun-15 12:49  senna 1 tab(s) orally 2 times a day, As needed, constipation 24-Jun-15 12:49  ALPRAZolam 0.125 milligram(s) orally once a day (at bedtime), As needed, anxiety 11-Nov-15 14:04  glipiZIDE-metFORMIN 2.5 milligram(s) orally 2 times a day (before meals) 24-Jun-15 12:49  potassium chloride 10 mEq oral tablet, extended release 1 tab(s) orally once a day 24-Jun-15 12:49  acetaminophen 325 mg oral tablet 2 tab(s) orally every 4 hours, As needed, pain or temp. greater than 100.4 11-Nov-15 14:04  losartan 25  mg oral tablet 1 tab(s) orally once a day 24-Jun-15 12:49  diltiazem 30 mg oral tablet 1 tab(s) orally every 6 hours 11-Nov-15 14:04  furosemide 20 mg oral tablet 1 tab(s) orally once a day 24-Jun-15 12:49  famotidine 20 mg oral tablet 1 tab(s) orally every 24 hours 24-Jun-15 12:49  Synthroid 50 mcg (0.05 mg) oral tablet 1 tab(s) orally once a day 24-Jun-15 12:49  aspirin 81 mg oral tablet 1 tab(s) orally once a day 11-Nov-15 14:04  multivitamin 1 tab(s) orally once a day 24-Jun-15 12:49  Vitamin B-12 500 mcg oral tablet 1 tab(s) orally once a day 24-Jun-15 12:49  Vitamin D3 1000 intl units oral tablet 1 tab(s) orally once a day 24-Jun-15 12:49  metoprolol 50 mg oral tablet, extended release 1 tab(s) orally once a day 24-Jun-15 12:49   Allergies:  Penicillin: Rash  Sulfa drugs: Swelling, Other  Epinephrine: Other  Other -Explain in Comment: Unknown  Allergies:  Allergies PCN, sulfa    Social/Family History: Employment Status: retired  Lives With: spouse  Living Arrangements: assisted living  Social History: no tob, no EtOH, no illicits  Family History: no seizures, no stroke   Vital Signs: **Vital Signs.:   12-Nov-15 18:00  Pulse Pulse 58  Respirations Respirations 15  Systolic BP Systolic BP 681  Diastolic BP (mmHg) Diastolic BP (mmHg) 75  Mean BP 102  Pulse Ox % Pulse Ox % 100  Pulse Ox Activity Level  At rest  Oxygen Delivery Ventilator Assisted   Physical Exam:  General: thin, moderate distress  HEENT: normocephalic, sclera nonicteric, oropharynx clear  Neck: supple, no JVD, no bruits  Chest: CTA B, no wheezing  Cardiac: tachycardic, no edema, 2+ pulses  Extremities: no C/C/E, FROM   Neurologic Exam: Mental Status: intubated, sedated, does not open or follow, GCS 3T  Cranial Nerves: R sx, 31m and sluggish L, corneals B, decent cough  Motor Exam: no movement to pain  Deep Tendon Reflexes: 0/4 B, mute plantars  Sensory Exam: no movement to pain  Coordination:  untestable due to mental status   Lab Results:  LabObservation:  12-Nov-15 09:02   OBSERVATION Reason for Test  Routine Micro:  11-Nov-15 10:54   Organism Name GRAM NEGATIVE ROD  Organism Quantity >100,000 CFU/ML  Organism 1 >100,000 CFU/ML GRAM NEGATIVE ROD    14:30   Micro Text Report BLOOD CULTURE   COMMENT                   NO GROWTH IN 18-24 HOURS   ANTIBIOTIC                       Specimen Source lt. iv  Culture Comment NO GROWTH IN 18-24 HOURS  Result(s) reported on 11 Mar 2014 at 02:00PM.  Routine Chem:  12-Nov-15 05:05   Glucose, Serum 92  BUN  21  Creatinine (comp)  1.60  Sodium, Serum 142  Potassium, Serum 3.9  Chloride, Serum 105  CO2, Serum 27  Calcium (Total), Serum 9.6  Osmolality (calc) 286  eGFR (African American)  39  eGFR (Non-African American)  32 (eGFR values <642mmin/1.73 m2 may be an indication of chronic kidney disease (CKD). Calculated eGFR, using the MRDR Study equation, is useful in  patients with stable renal function. The eGFR calculation will not be reliable in acutely ill patients when serum creatinine is changing rapidly. It is not useful in patients on dialysis. The eGFR calculation may not be applicable to patients at the low and high extremes of body sizes, pregnant women, and vegetarians.)  Anion Gap 10  Cholesterol, Serum  253  Triglycerides, Serum  218  HDL (INHOUSE)  30  VLDL Cholesterol Calculated  44  LDL Cholesterol Calculated  179 (Result(s) reported on 11 Mar 2014 at 05:39AM.)  Cardiac:  11-Nov-15 10:54   Troponin I < 0.02 (0.00-0.05 0.05 ng/mL or less: NEGATIVE  Repeat testing in 3-6 hrs  if clinically indicated. >0.05 ng/mL: POTENTIAL  MYOCARDIAL INJURY. Repeat  testing in 3-6 hrs if  clinically indicated. NOTE: An increase or decrease  of 30% or more on serial  testing suggests a  clinically important change)  Routine UA:  11-Nov-15 10:54   Color (UA) Yellow  Clarity (UA) Turbid  Glucose (UA) Negative   Bilirubin (UA) Negative  Ketones (UA) Trace  Specific Gravity (UA) 1.011  Blood (UA) Negative  pH (UA) 7.0  Protein (UA) 100 mg/dL  Nitrite (UA) Negative  Leukocyte Esterase (UA) 3+ (Result(s) reported on 10 Mar 2014 at 12:34PM.)  RBC (UA) 14 /HPF  WBC (UA) 1251 /HPF  Bacteria (UA) 1+  Epithelial Cells (UA) 4 /HPF  WBC Clump (UA) PRESENT (Result(s) reported on 10 Mar 2014 at 12:34PM.)  Routine Hem:  12-Nov-15 05:05   WBC (CBC) 7.6  RBC (CBC) 4.32  Hemoglobin (CBC) 12.1  Hematocrit (CBC) 37.0  Platelet Count (CBC) 190  MCV 86  MCH 28.0  MCHC 32.7  RDW  15.4  Neutrophil % 53.1  Lymphocyte % 30.7  Monocyte %  11.2  Eosinophil % 4.2  Basophil % 0.8  Neutrophil # 4.1  Lymphocyte # 2.3  Monocyte # 0.9  Eosinophil # 0.3  Basophil # 0.1 (Result(s) reported on 11 Mar 2014 at 05:46AM.)   Radiology Results: [place]:    11-Nov-15 15:58, US Carotid Doppler Bilateral  US Carotid Doppler Bilateral   REASON FOR EXAM:    CVA  COMMENTS:       PROCEDURE: [place]  - US CAROTID DOPPLER BILATERAL  - Mar 10 2014  3:58PM     CLINICAL DATA:  Acute stroke symptoms, hypertension, diabetes    EXAM:  BILATERAL CAROTID DUPLEX ULTRASOUND    TECHNIQUE:  Pearline Cables scale imaging, color Doppler and duplex ultrasound were  performed of bilateral carotid and vertebral arteries in the neck.    COMPARISON:  03/09/2014 CT head without contrast  FINDINGS:  Criteria: Quantification of carotid stenosis is based on velocity  parameters that correlate the residual internal carotid diameter  with NASCET-based stenosis levels, using the diameter of the distal  internal carotid lumen as the denominator for stenosis measurement.    The following velocity measurements were obtained:    RIGHT    ICA:  65/16 cm/sec    CCA:  54/65 cm/sec    SYSTOLIC ICA/CCA RATIO:  0.35  DIASTOLIC ICA/CCA RATIO:  4.65    ECA:  145 cm/sec    LEFT    ICA:  67/14 cm/sec    CCA:  68/12 cm/sec    SYSTOLIC ICA/CCA RATIO:   7.51    DIASTOLIC ICA/CCA RATIO:  7.00    ECA:  109 cm/sec  RIGHT CAROTID ARTERY: Minor echogenic shadowing plaque formation. No  hemodynamically significant right ICA stenosis, velocity elevation,  or turbulent flow. Degree of narrowing less than 50%.    RIGHT VERTEBRAL ARTERY:  Antegrade    LEFT CAROTID ARTERY: Similar scattered minor echogenic plaque  formation. No hemodynamically significant left ICA stenosis,  velocity elevation, or turbulent flow.    LEFT VERTEBRAL ARTERY:  Antegrade     IMPRESSION:  Mild carotid atherosclerosis. No hemodynamically significant ICA  stenosis by ultrasound. Degree of narrowing less than 50%  bilaterally      Electronically Signed    By: Daryll Brod M.D.    On: 03/10/2014 16:13         Verified By: Earl Gala, M.D.,  MRI:    12-Nov-15 08:30, MRI Brain Without Contrast  MRI Brain Without Contrast   REASON FOR EXAM:    CVA  COMMENTS:       PROCEDURE: MR  - MR BRAIN WO CONTRAST  - Mar 11 2014  8:30AM     CLINICAL DATA:  Acute speech disturbance in weakness.    EXAM:  MRI HEAD WITHOUT CONTRAST    TECHNIQUE:  Multiplanar, multiecho pulse sequences of the brain and surrounding  structures were obtained without intravenous contrast.    COMPARISON:  Head CT 03/09/2014  FINDINGS:  There is a 4 cm region of acute infarction in the left posterior  frontal lobe affecting the cortical and subcortical brain. There is  mild swelling but there is no mass effect or hemorrhage. No other  acute infarction.    There are mild chronic small-vessel ischemic changes of the pons.  There are a few old small vessel cerebellar insults. There is an old  lacunar infarction in the left thalamus. There are mild chronic  small-vessel ischemic changes within the cerebral hemispheric white  matter. No evidence of mass lesion,  hemorrhage, hydrocephalus or  extra-axial collection. Major vessels at the base of the brain show  flow. No pituitary mass.  No inflammatory sinus disease. No skull or  skullbase lesion.   IMPRESSION:  4 cm region of acute infarction in the left posterior frontal lobe.  Mild swelling but no hemorrhage or mass effect. Consistent with left  MCA branch vessel territory infarction.    Chronic small-vessel changes elsewhere throughout the brain as  outlined above.      Electronically Signed    By: Nelson Chimes M.D.    On: 03/11/2014 09:24         Verified By: Jules Schick, M.D.,   Radiology Impression: Radiology Impression: MRI personally reviewed by me and shows a L frontal infarct   Impression/Recommendations: Recommendations:   prior notes reviewed by me reviewed by me and show UTI   Status epilepticus- pt has had multiple seizures and has not really returned to baseline;  seizure threshold lowered by UTI and by previous Levaquin as well as new stroke;  pt is at risk for further brain damage from continued seizures L MCA infarct-  this is embolic in nature and symptomatic with aphasia only UTI-  needs treatment d/c Dilantin continue Vimpat 279m q12h add Keppra [id number] start Versed at 78m77mr and start weaning at 6am for EEG start Fentanyl 18m578mr and wean at 6am as well ASA 300mg18muntil PO is established pending echo EEG in am pt will need to be transferred to outside hospital if EEG shows continued seizures treat UTI keep Mg > 2, Ca > 8 and Na > 130 will follow closely minutes of critical care time spent, examining pt, reviewing chart and coordinating care  Electronic Signatures: SmithJamison Neighbor  (Signed 12-Nov-15 23:01)  Authored: REFERRING PHYSICIAN, Primary Care Physician, Consult, History of Present Illness, Review of Systems, PAST MEDICAL/SURGICAL HISTORY, HOME MEDICATIONS, ALLERGIES, Social/Family History, NURSING VITAL SIGNS, Physical Exam-, LAB RESULTS, RADIOLOGY RESULTS, Recommendations   Last Updated: 12-Nov-15 23:01 by SmithJamison Neighbor

## 2014-08-22 NOTE — H&P (Signed)
PATIENT NAME:  [name], [name] MR#:  [id number] DATE OF BIRTH:  09/25/1925  DATE OF ADMISSION:  11/12/2011  PRIMARY CARE PHYSICIAN: Bethann PunchesMark Miller, MD  PRESENTING COMPLAINT: I passed out. History is obtained from the patient's husband who witnessed a syncopal episode. The patient does not remember anything of the event.  HISTORY OF PRESENT ILLNESS: [name] is a very pleasant 81 year old Caucasian female with past medical history of hypertension and diabetes who went to the bathroom in the early hours of the morning and her husband walked her to the bathroom. She was doing well. She took an unusual long time to come out hence her husband went to check in the bathroom. The patient was sitting on the commode but her head was slumped over on the sink that was next to her. The patient's husband could not wake her up hence he called 911, laid the patient down on the floor. EMTs were reached and in approximately 10 minutes the patient woke up not remembering the event. The husband did not notice any seizures or incontinence. There was no focal weakness either. The patient was brought to the Emergency Room. Initially blood pressure was elevated to 217/59, repeat was 199/59, and during my evaluation blood pressure was down to 126/57. EKG shows sinus rhythm with first degree AV block. She is being admitted for further evaluation and management.   PAST MEDICAL HISTORY:  1. Type 2 diabetes.  2. Hypertension.  3. Arthritis.   DRUG ALLERGIES: Epinephrine, penicillin, sulfa, and caffeine.   MEDICATIONS:  1. COQ-10 100 mg capsule p.o. daily.  2. Glipizide/metformin 2.5/500 mg 1 tablet twice a day. 3. Hydrochlorothiazide 12.5/100 mg 1 tablet once a day. 4. Levothyroxine 50 mcg p.o. daily.  5. Os-Cal D3 one tablet daily.  6. Tramadol 50 mg daily.  7. Xanax 0.25 mg p.o. daily as needed for nerves.   FAMILY HISTORY: Positive for hypertension.   SOCIAL HISTORY: She lives at home with her husband. Denies any  history of smoking or alcohol use.   REVIEW OF SYSTEMS: CONSTITUTIONAL: No fever, fatigue. Positive for weakness. EYES: No blurred or double vision. No glaucoma. ENT: No tinnitus, ear pain, or hearing loss. RESPIRATORY: No cough, wheeze, or hemoptysis. CARDIOVASCULAR: No chest pain, orthopnea, or edema. Positive for syncope. GASTROINTESTINAL: No nausea, vomiting, diarrhea, abdominal pain, or gastroesophageal reflux disease. GENITOURINARY: No dysuria or hematuria. ENDOCRINE: No polyuria or nocturia. HEMATOLOGY: No anemia or easy bruising. SKIN: No acne or rash. MUSCULOSKELETAL: Positive for arthritis. NEUROLOGIC: Positive for syncopal episode. No weakness or numbness. PSYCH: No anxiety or depression. All other systems reviewed and negative.   PHYSICAL EXAMINATION:   GENERAL: The patient is awake, alert, and oriented x3, not in acute distress.   VITAL SIGNS: She is afebrile, pulse 76 regular sinus rhythm, blood pressure 126/57, and saturations are 95% on room air.   HEENT: Atraumatic, normocephalic. Pupils are equal, round, and reactive to light and accommodation. Extraocular movements intact. Oral mucosa is moist.   NECK: Supple. No JVD. No carotid bruit.   LUNGS: Clear to auscultation bilaterally. No rales, rhonchi, respiratory distress, or labored breathing.   HEART: Both heart sounds are normal. Rate and rhythm is regular. PMI is not lateralized. Chest is nontender.   EXTREMITIES: Good pedal pulses. Good femoral pulses. No lower extremity edema.   ABDOMEN: Soft, benign, and nontender. No organomegaly. Positive bowel sounds.   NEUROLOGIC: Grossly intact cranial nerves II through XII. No focal neuro deficits. No motor or sensory deficits either. Reflexes  are 1+ in both upper and lower extremities. Plantars are downgoing bilaterally.   SKIN: Warm and dry.   PSYCH: The patient is awake, alert, oriented x3.   LABORATORY, DIAGNOSTIC AND RADIOLOGIC DATA: Urinalysis is positive for urinary  tract infection.  CT of the head: No acute abnormality.   Chest x-ray: Pulmonary vascular congestion without focal evidence of consolidation.   CBC within normal limits. Cardiac enzymes, first set, negative. Glucose 173, BUN 20, creatinine 1.1, serum sodium 131, potassium 4, and chloride 94. LFTs are within normal limits. Troponin 0.02.   EKG shows sinus rhythm with first degree AV block.   ASSESSMENT: 81 year old [name] with: 1. Syncopal episode likely due to uncontrolled hypertension. Blood pressure on arrival was 217/99 and repeat 199/59, now much better after the patient has been in the ER during my evaluation. No injury, seizures, focal weakness, or any incontinence. EKG shows sinus rhythm with first degree AV block. We will admit the patient on telemetry for Dr. Bethann Punches. Continue low sodium, 2 gram salt, ADA 1800 calorie diet. IV fluids for about another liter. We will continue all her home medications including her blood pressure medications. We will monitor her on telemetry. Order a carotid ultrasound. Since the patient does not have any neuro deficit I will hold off on MRI of the brain at present.  2. Accelerated hypertension, improved. We will continue to monitor blood pressure in the hospital. Continue her hydrochlorothiazide/losartan as she is taking at home and adjust medication as needed.  3. Asymptomatic urinary tract infection. Give a course of Levaquin.  4. Type 2 diabetes. We will continue glipizide and sliding scale while she is in house.      The above was discussed with the patient and her husband who are agreeable to it. The case was also discussed with Dr. Hyacinth Meeker who will follow while the patient is in house.   TIME SPENT: 50 minutes. ____________________________ Wylie Hail Allena Katz, MD sap:slb D: 11/12/2011 12:24:12 ET T: 11/12/2011 12:44:53 ET JOB#: [id number]  cc: Antoine Vandermeulen A. Allena Katz, MD, <Dictator> Danella Penton, MD Willow Ora MD ELECTRONICALLY SIGNED 11/15/2011  13:46

## 2015-11-14 IMAGING — CR DG CHEST 1V PORT
1 series · 1 of 1 positions shown · non-contrast
Comparison: 11/27/2011

CLINICAL DATA: Chest pain

EXAM:
PORTABLE CHEST - 1 VIEW

[ap]
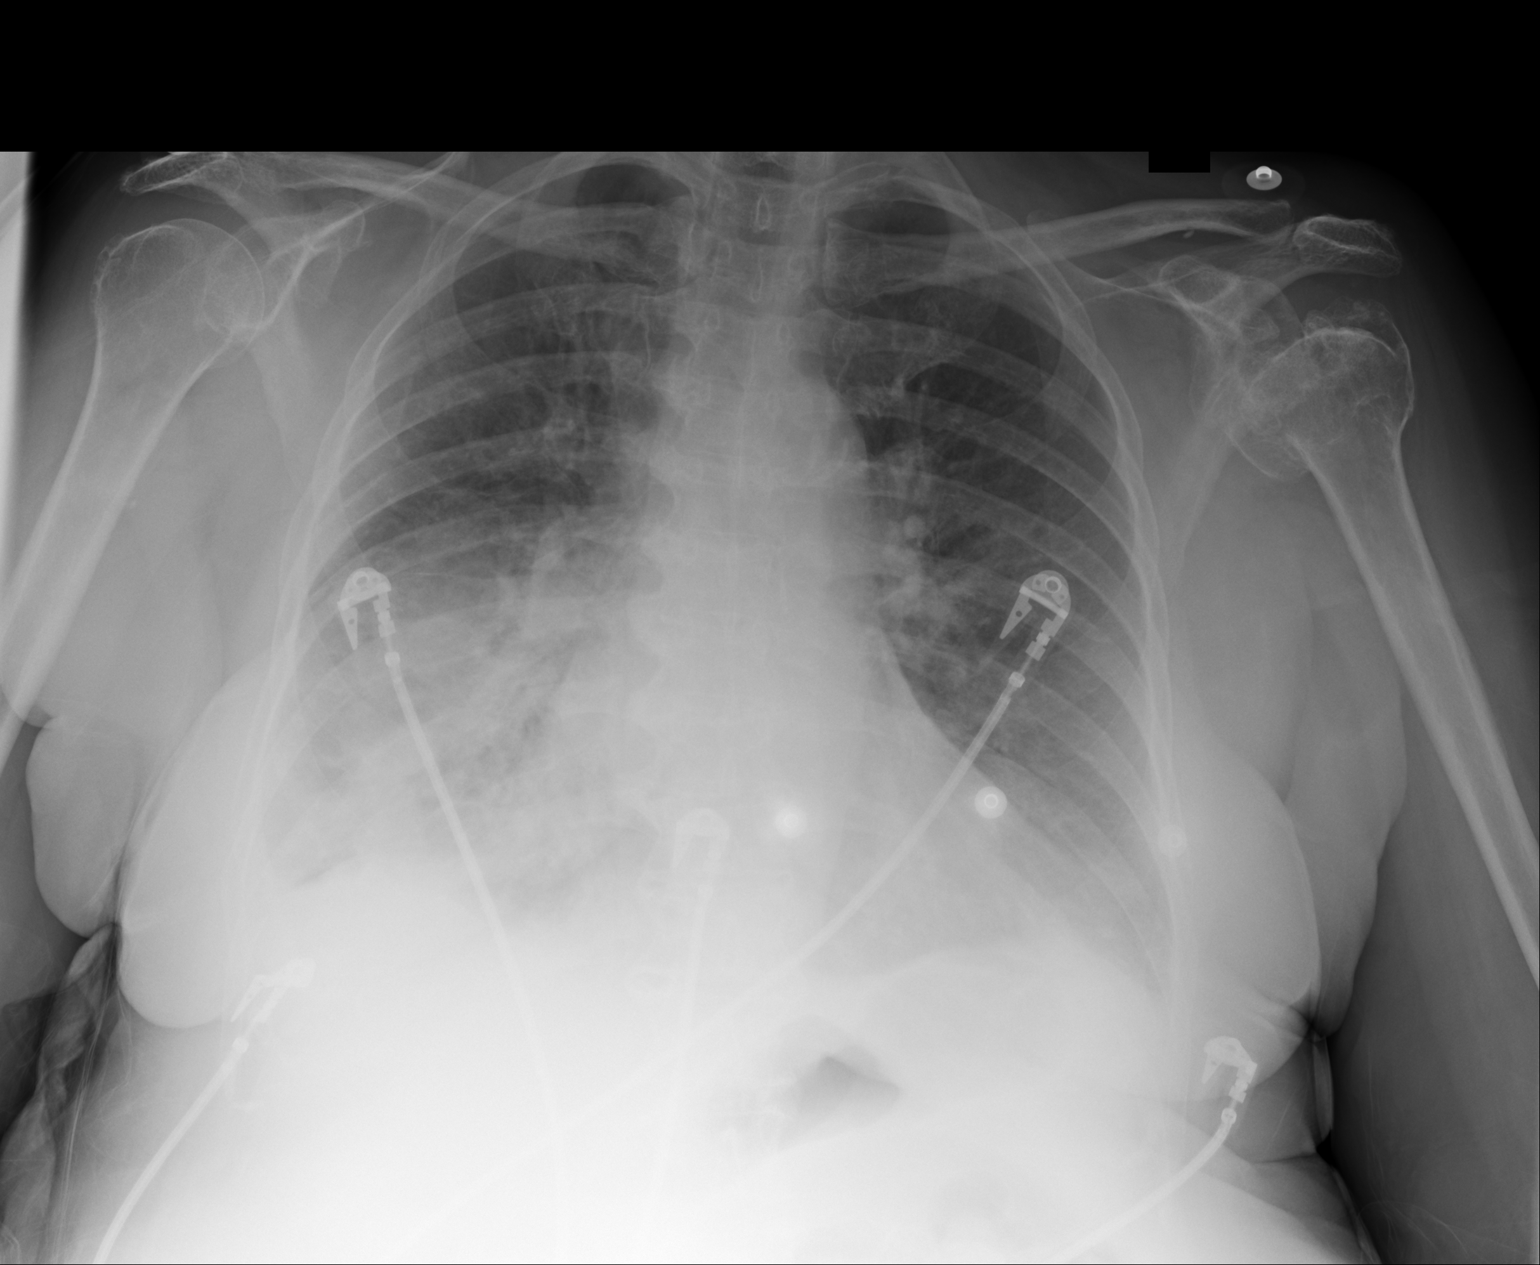

[1 of 1 positions shown; findings below may reference images not displayed]

FINDINGS: Cardiac shadow is stable. The lungs are well aerated bilaterally
although new patchy right basilar infiltrate is noted consistent
with acute pneumonia. The left lung remains clear. Chronic fracture
of the left proximal humerus is seen.
IMPRESSION: New right basilar pneumonia.  Followup films are recommended.

## 2015-11-16 IMAGING — CR DG CHEST 1V PORT
1 series · 1 of 1 positions shown · non-contrast
Comparison: Chest x-ray 10/08/2013.

CLINICAL DATA: Wheezing.  Chest tightness.

EXAM:
PORTABLE CHEST - 1 VIEW

[ap]
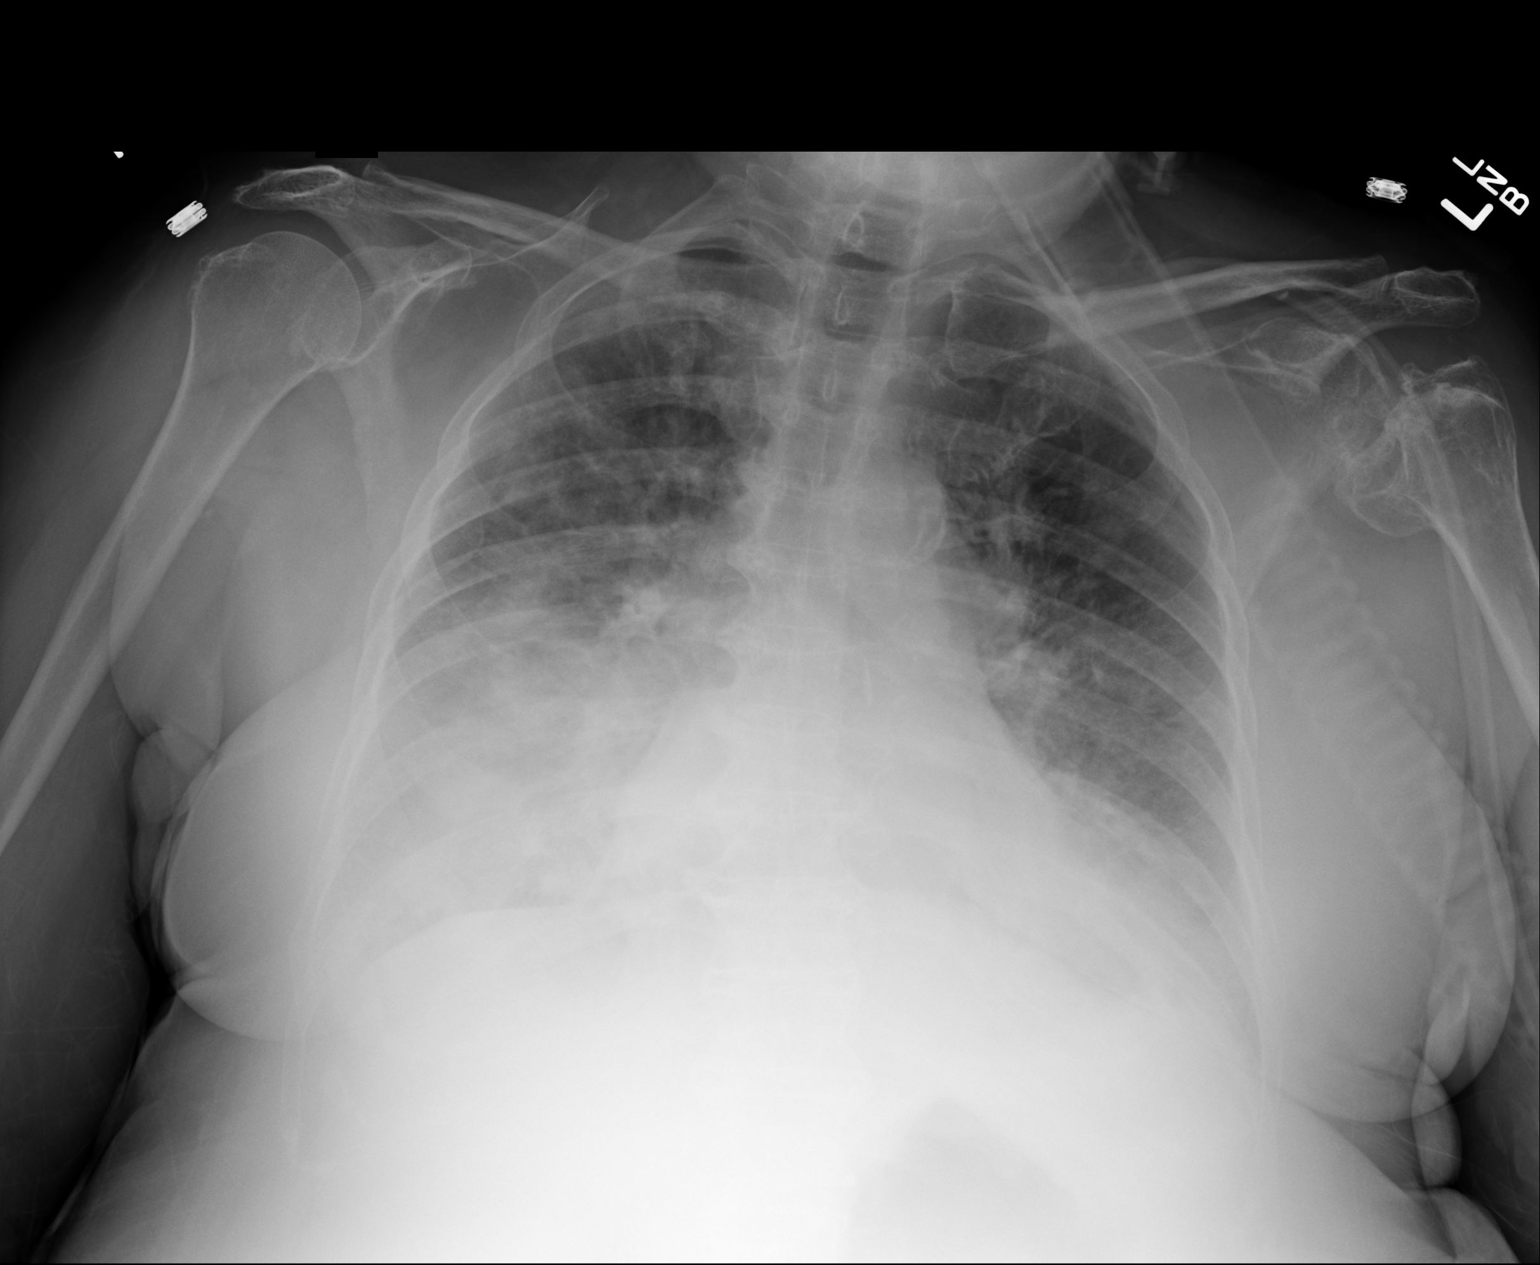

[1 of 1 positions shown; findings below may reference images not displayed]

FINDINGS: Moderate right and small left pleural effusions. Extensive opacities
throughout the mid to lower lungs bilaterally (right greater than
left), concerning for multifocal airspace consolidation from either
aspiration or multilobar pneumonia. More ill-defined patchy
opacities are also noted in the right upper lobe. Sparing of the
left upper lobe. Findings are not favored to reflect pulmonary edema
given the asymmetry of the findings. Heart size appears within
normal limits. Upper mediastinal contours are slightly distorted by
patient's rotation to the left, but appear within normal limits.
Atherosclerosis in the thoracic aorta.
IMPRESSION: 1. Findings are concerning for progressively worsening multilobar
pneumonia, as above.
2. Increasing moderate right and small left pleural effusions.
3. Atherosclerosis.

## 2015-11-17 IMAGING — CR DG CHEST 1V PORT
1 series · 1 of 1 positions shown · non-contrast
Comparison: 10/09/2013

CLINICAL DATA: Pneumonia.  Ventilation.

EXAM:
PORTABLE CHEST - 1 VIEW

[ap]
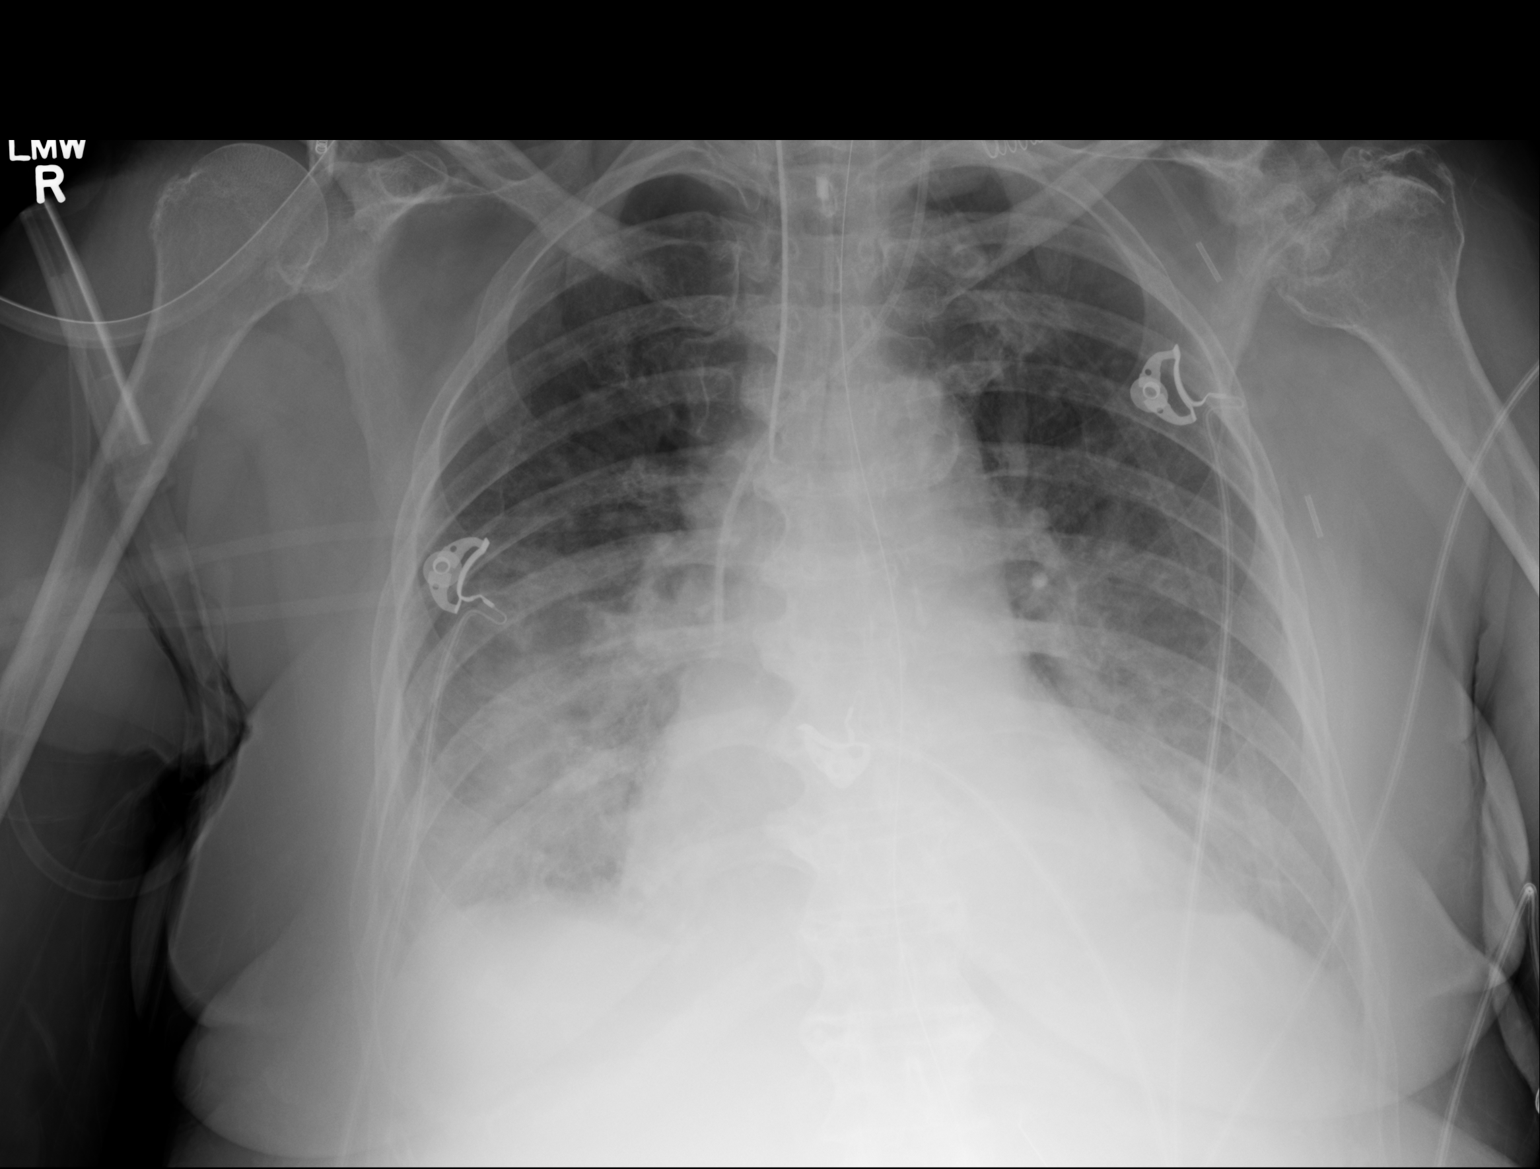

[1 of 1 positions shown; findings below may reference images not displayed]

FINDINGS: Lower endotracheal tube, tip 2 cm above the carina. A feeding tube
continues inferior to the diaphragm. Left IJ central line, tip at
the upper cavoatrial junction.

Mild cardiomegaly is stable from prior. Unchanged upper mediastinal
contours.

Haziness of the lower chest suggesting pleural fluid. Superimposed
airspace opacity appears similar. No pneumothorax.

Chronic dislocation of the left glenohumeral joint.
IMPRESSION: 1. Endotracheal tube ends 2 cm above the carina.
2. Unchanged basilar pneumonia and layering pleural effusion.

## 2015-11-18 IMAGING — CR DG CHEST 1V PORT
1 series · 1 of 1 positions shown · non-contrast
Comparison: 10/10/2013

CLINICAL DATA: Decreased O2 sats.

EXAM:
PORTABLE CHEST - 1 VIEW

[ap]
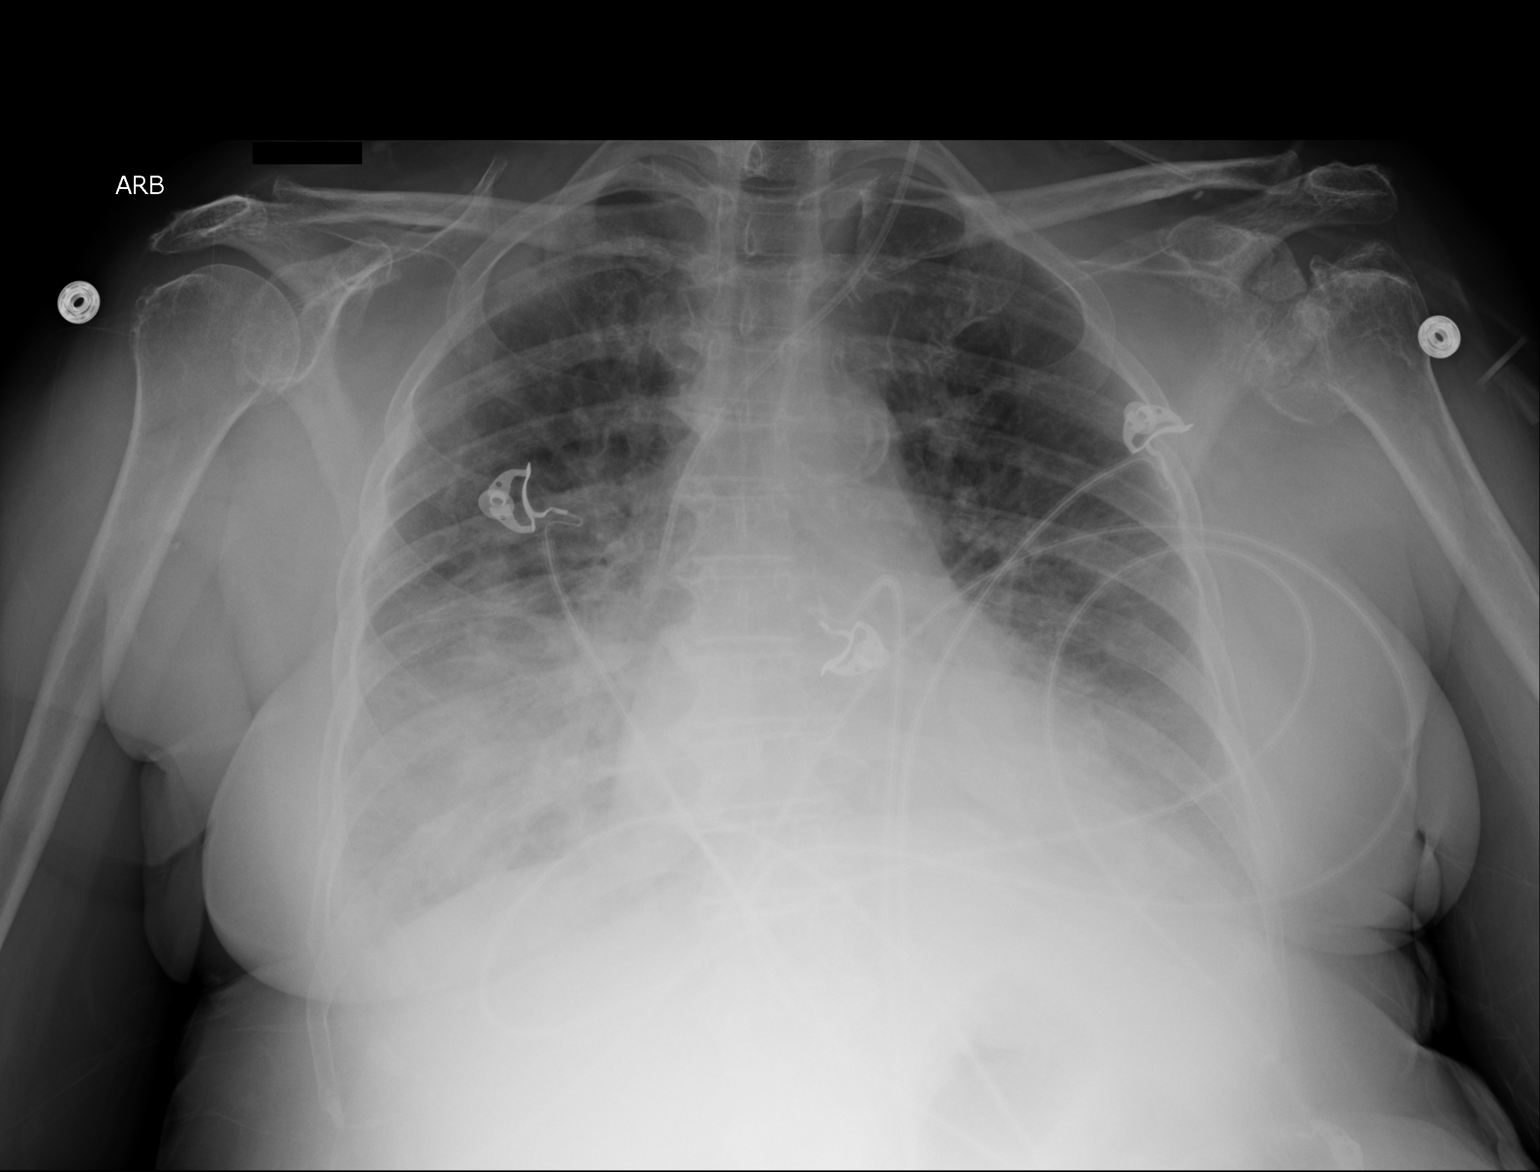

[1 of 1 positions shown; findings below may reference images not displayed]

FINDINGS: Interval extubation. Left central line remains in place, unchanged.
Bilateral perihilar and lower lobe opacities are again noted,
stable. Small bilateral effusions. No acute bony abnormality. No
significant change.
IMPRESSION: No interval change in bilateral lower lobe airspace opacities/
consolidation and layering effusions.
